# Patient Record
Sex: Male | Born: 1980 | Race: Black or African American | Hispanic: No | Marital: Married | Smoking: Current every day smoker
Health system: Southern US, Community
[De-identification: ages and names within clinical notes are randomized; demographics above are authoritative.]

## PROBLEM LIST (undated history)

## (undated) DIAGNOSIS — F319 Bipolar disorder, unspecified: Secondary | ICD-10-CM

## (undated) DIAGNOSIS — J45909 Unspecified asthma, uncomplicated: Secondary | ICD-10-CM

## (undated) DIAGNOSIS — I10 Essential (primary) hypertension: Secondary | ICD-10-CM

## (undated) DIAGNOSIS — F209 Schizophrenia, unspecified: Secondary | ICD-10-CM

## (undated) HISTORY — PX: OTHER SURGICAL HISTORY: SHX169

---

## 2013-11-12 ENCOUNTER — Emergency Department (INDEPENDENT_AMBULATORY_CARE_PROVIDER_SITE_OTHER)
Admission: EM | Admit: 2013-11-12 | Discharge: 2013-11-12 | Disposition: A | Discharge status: Home / Self Care | Payer: Medicare Other | Source: Home / Self Care | Attending: Emergency Medicine | Admitting: Emergency Medicine

## 2013-11-12 ENCOUNTER — Emergency Department (INDEPENDENT_AMBULATORY_CARE_PROVIDER_SITE_OTHER): Payer: Medicare Other

## 2013-11-12 ENCOUNTER — Encounter (HOSPITAL_COMMUNITY): Payer: Self-pay | Admitting: Emergency Medicine

## 2013-11-12 DIAGNOSIS — R030 Elevated blood-pressure reading, without diagnosis of hypertension: Secondary | ICD-10-CM

## 2013-11-12 DIAGNOSIS — M79676 Pain in unspecified toe(s): Secondary | ICD-10-CM

## 2013-11-12 DIAGNOSIS — IMO0001 Reserved for inherently not codable concepts without codable children: Secondary | ICD-10-CM

## 2013-11-12 DIAGNOSIS — M79609 Pain in unspecified limb: Secondary | ICD-10-CM

## 2013-11-12 NOTE — ED Notes (Signed)
Pt c/o pain 5th toe from greater toe onset 2 days... Recalls waking up and experiencing pain  Denies: inj/trauma... Pain increases w/activity BP today is 147/120... Denies: CP, SOB, nauseas, diaphoresis.  He is alert w/no signs of acute distress.

## 2013-11-12 NOTE — ED Provider Notes (Signed)
CSN: [id number]     Arrival date & time 11/12/13  1042 History   None    Chief Complaint  Patient presents with  . Toe Pain   (Consider location/radiation/quality/duration/timing/severity/associated sxs/prior Treatment) HPI Comments: Stubbed toe 2 days ago and has been hurting since; hurts to walk on it so it limping.   Patient is a 33 y.o. male presenting with toe pain. The history is provided by the patient.  Toe Pain This is a new problem. The current episode started 2 days ago. The problem occurs constantly. The problem has not changed since onset.Nothing aggravates the symptoms. Nothing relieves the symptoms. He has tried nothing for the symptoms.    History reviewed. No pertinent past medical history. History reviewed. No pertinent past surgical history. History reviewed. No pertinent family history. History  Substance Use Topics  . Smoking status: Current Every Day Smoker -- 1.00 packs/day    Types: Cigarettes  . Smokeless tobacco: Not on file  . Alcohol Use: Yes    Review of Systems  Constitutional: Negative for fever and chills.  Musculoskeletal:       Toe injury  Skin: Negative for color change and wound.    Allergies  Review of patient's allergies indicates no known allergies.  Home Medications  No current outpatient prescriptions on file. BP 147/120  Pulse 76  Temp(Src) 98.3 F (36.8 C) (Oral)  Resp 16  SpO2 100% Physical Exam  Constitutional: He appears well-developed and well-nourished. No distress.  Musculoskeletal:       Right foot: He exhibits bony tenderness. He exhibits normal range of motion, no swelling and no deformity.  R 5th toe tender to palp  Skin: Skin is warm and dry. No bruising noted. No erythema.    ED Course  Procedures (including critical care time) Labs Review Labs Reviewed - No data to display Imaging Review Dg Toe 5th Right  11/12/2013   CLINICAL DATA:  Right little toe injury and pain.  EXAM: RIGHT FIFTH TOE - 3 view   COMPARISON:  None.  FINDINGS: There is no evidence of fracture or dislocation. There is no evidence of arthropathy or other focal bone abnormality. Soft tissues are unremarkable.  IMPRESSION: Negative.   Electronically Signed   By: Myles RosenthalJohn  Stahl M.D.   On: 11/12/2013 13:18    EKG Interpretation    Date/Time:    Ventricular Rate:    PR Interval:    QRS Duration:   QT Interval:    QTC Calculation:   R Axis:     Text Interpretation:              MDM   1. Toe pain   2. Elevated blood pressure   Bp remains elevated on recheck. Pt reports being told his bp was high 5 years ago, but has never been recommended to take medicine for it. Recommended pt monitor bp for 3 weeks, record readings and take to pcp if they remain over 120/80.       Cathlyn ParsonsAngela M Antonina Deziel, NP 11/12/13 [phone]

## 2013-11-12 NOTE — Discharge Instructions (Signed)
Soak your toe in warm water to help with the pain. Try using aleve as directed on the bottle for pain.   Take your blood pressure 1-2 times per week for the next 3 weeks and keep a record of it.  If your blood pressure continues to be greater than 120/80 (if either number is high), talk to your doctor about getting blood pressure medicine.    Hypertension Hypertension is another name for high blood pressure. High blood pressure may mean that your heart needs to work harder to pump blood. Blood pressure consists of two numbers, which includes a higher number over a lower number (example: 110/72). HOME CARE   Make lifestyle changes as told by your doctor. This may include weight loss and exercise.  Take your blood pressure medicine every day.  Limit how much salt you use.  Stop smoking if you smoke.  Do not use drugs.  Talk to your doctor if you are using decongestants or birth control pills. These medicines might make blood pressure higher.  Females should not drink more than 1 alcoholic drink per day. Males should not drink more than 2 alcoholic drinks per day.  See your doctor as told. GET HELP RIGHT AWAY IF:   You have a blood pressure reading with a top number of 180 or higher.  You get a very bad headache.  You get blurred or changing vision.  You feel confused.  You feel weak, numb, or faint.  You get chest or belly (abdominal) pain.  You throw up (vomit).  You cannot breathe very well. MAKE SURE YOU:   Understand these instructions.  Will watch your condition.  Will get help right away if you are not doing well or get worse. Document Released: 03/29/2008 Document Revised: 01/03/2012 Document Reviewed: 03/29/2008 [facility] Patient Information 2014 [employer] [place].

## 2013-11-14 NOTE — ED Provider Notes (Signed)
Medical screening examination/treatment/procedure(s) were performed by non-physician practitioner and as supervising physician I was immediately available for consultation/collaboration.  Netta Fodge, M.D.  Dushawn Pusey C Deni Lefever, MD 11/14/13 0753 

## 2013-11-19 ENCOUNTER — Encounter (HOSPITAL_COMMUNITY): Payer: Self-pay | Admitting: Emergency Medicine

## 2013-11-19 ENCOUNTER — Emergency Department (HOSPITAL_COMMUNITY)
Admission: EM | Admit: 2013-11-19 | Discharge: 2013-11-19 | Discharge status: Against Medical Advice | Payer: Medicare Other | Attending: Emergency Medicine | Admitting: Emergency Medicine

## 2013-11-19 DIAGNOSIS — R079 Chest pain, unspecified: Secondary | ICD-10-CM | POA: Insufficient documentation

## 2013-11-19 HISTORY — DX: Schizophrenia, unspecified: F20.9

## 2013-11-19 HISTORY — DX: Essential (primary) hypertension: I10

## 2013-11-19 HISTORY — DX: Bipolar disorder, unspecified: F31.9

## 2013-11-19 HISTORY — DX: Unspecified asthma, uncomplicated: J45.909

## 2013-11-19 LAB — POCT I-STAT TROPONIN I: Troponin i, poc: 0 ng/mL (ref 0.00–0.08)

## 2013-11-19 LAB — CBC
HEMATOCRIT: 46.6 % (ref 39.0–52.0)
Hemoglobin: 16.4 g/dL (ref 13.0–17.0)
MCH: 35 pg — AB (ref 26.0–34.0)
MCHC: 35.2 g/dL (ref 30.0–36.0)
MCV: 99.4 fL (ref 78.0–100.0)
Platelets: 208 10*3/uL (ref 150–400)
RBC: 4.69 MIL/uL (ref 4.22–5.81)
RDW: 12.1 % (ref 11.5–15.5)
WBC: 7.6 10*3/uL (ref 4.0–10.5)

## 2013-11-19 LAB — BASIC METABOLIC PANEL
BUN: 22 mg/dL (ref 6–23)
CO2: 23 mEq/L (ref 19–32)
CREATININE: 1.36 mg/dL — AB (ref 0.50–1.35)
Calcium: 9.4 mg/dL (ref 8.4–10.5)
Chloride: 102 mEq/L (ref 96–112)
GFR calc Af Amer: 78 mL/min — ABNORMAL LOW (ref >90)
GFR, EST NON AFRICAN AMERICAN: 68 mL/min — AB (ref >90)
Glucose, Bld: 95 mg/dL (ref 70–99)
Potassium: 4.7 mEq/L (ref 3.7–5.3)
Sodium: 140 mEq/L (ref 137–147)

## 2013-11-19 LAB — PRO B NATRIURETIC PEPTIDE: Pro B Natriuretic peptide (BNP): 10.7 pg/mL (ref 0–125)

## 2013-11-19 MED ORDER — ASPIRIN 81 MG PO CHEW
324.0000 mg | CHEWABLE_TABLET | Freq: Once | ORAL | Status: AC
  Administered 2013-11-19: 324 mg via ORAL
  Filled 2013-11-19: qty 4

## 2013-11-19 NOTE — ED Notes (Signed)
Patient states he was getting ready to eat and started with CP to the left chest.  Denies radiating, +SOB

## 2015-05-31 ENCOUNTER — Emergency Department (HOSPITAL_COMMUNITY): Payer: Medicare Other

## 2015-05-31 ENCOUNTER — Encounter (HOSPITAL_COMMUNITY): Payer: Self-pay | Admitting: Emergency Medicine

## 2015-05-31 ENCOUNTER — Emergency Department (HOSPITAL_COMMUNITY)
Admission: EM | Admit: 2015-05-31 | Discharge: 2015-05-31 | Disposition: A | Discharge status: Home / Self Care | Payer: Medicare Other | Attending: Emergency Medicine | Admitting: Emergency Medicine

## 2015-05-31 DIAGNOSIS — I10 Essential (primary) hypertension: Secondary | ICD-10-CM | POA: Diagnosis not present

## 2015-05-31 DIAGNOSIS — Z8659 Personal history of other mental and behavioral disorders: Secondary | ICD-10-CM | POA: Diagnosis not present

## 2015-05-31 DIAGNOSIS — R0789 Other chest pain: Secondary | ICD-10-CM | POA: Insufficient documentation

## 2015-05-31 DIAGNOSIS — Z720 Tobacco use: Secondary | ICD-10-CM | POA: Diagnosis not present

## 2015-05-31 DIAGNOSIS — J45909 Unspecified asthma, uncomplicated: Secondary | ICD-10-CM | POA: Insufficient documentation

## 2015-05-31 DIAGNOSIS — R079 Chest pain, unspecified: Secondary | ICD-10-CM | POA: Diagnosis present

## 2015-05-31 LAB — CBC
HCT: 42.2 % (ref 39.0–52.0)
Hemoglobin: 14.6 g/dL (ref 13.0–17.0)
MCH: 35 pg — ABNORMAL HIGH (ref 26.0–34.0)
MCHC: 34.6 g/dL (ref 30.0–36.0)
MCV: 101.2 fL — ABNORMAL HIGH (ref 78.0–100.0)
Platelets: 216 10*3/uL (ref 150–400)
RBC: 4.17 MIL/uL — ABNORMAL LOW (ref 4.22–5.81)
RDW: 12.4 % (ref 11.5–15.5)
WBC: 9.2 10*3/uL (ref 4.0–10.5)

## 2015-05-31 LAB — BASIC METABOLIC PANEL
Anion gap: 8 (ref 5–15)
BUN: 12 mg/dL (ref 6–20)
CO2: 30 mmol/L (ref 22–32)
Calcium: 9.3 mg/dL (ref 8.9–10.3)
Chloride: 106 mmol/L (ref 101–111)
Creatinine, Ser: 1.1 mg/dL (ref 0.61–1.24)
GFR calc Af Amer: >60 mL/min (ref >60)
GFR calc non Af Amer: >60 mL/min (ref >60)
Glucose, Bld: 118 mg/dL — ABNORMAL HIGH (ref 65–99)
Potassium: 4.3 mmol/L (ref 3.5–5.1)
Sodium: 144 mmol/L (ref 135–145)

## 2015-05-31 LAB — I-STAT TROPONIN, ED: Troponin i, poc: 0 ng/mL (ref 0.00–0.08)

## 2015-05-31 MED ORDER — METHOCARBAMOL 500 MG PO TABS: 1000.0000 mg | ORAL_TABLET | Freq: Four times a day (QID) | ORAL | Status: DC | PRN

## 2015-05-31 MED ORDER — NAPROXEN 250 MG PO TABS: 250.0000 mg | ORAL_TABLET | Freq: Two times a day (BID) | ORAL | Status: DC | PRN

## 2015-05-31 MED ORDER — KETOROLAC TROMETHAMINE 30 MG/ML IJ SOLN
30.0000 mg | Freq: Once | INTRAMUSCULAR | Status: AC
  Administered 2015-05-31: 30 mg via INTRAVENOUS
  Filled 2015-05-31: qty 1

## 2015-05-31 MED ORDER — OXYCODONE-ACETAMINOPHEN 5-325 MG PO TABS
2.0000 | ORAL_TABLET | Freq: Once | ORAL | Status: AC
  Administered 2015-05-31: 2 via ORAL
  Filled 2015-05-31: qty 2

## 2015-05-31 MED ORDER — HYDROCODONE-ACETAMINOPHEN 5-325 MG PO TABS: ORAL_TABLET | ORAL | Status: DC

## 2015-05-31 NOTE — ED Provider Notes (Signed)
CSN: [id number]     Arrival date & time 05/31/15  2041 History   First MD Initiated Contact with Patient 05/31/15 2057     Chief Complaint  Patient presents with  . Chest Pain      HPI Pt was seen at 2105. Per pt, c/o gradual onset and persistence of constant left sided chest wall "pain" for the past 2 days. Pain worsens with palpation of the area and body position changes. Pt has not taken any medication to treat his symptoms. Denies injury, no palpitations, no SOB/cough, no abd pain, no N/V/D, no back pain, no fevers, no rash.    Past Medical History  Diagnosis Date  . Hypertension   . Asthma   . Bipolar affective   . Schizophrenia    Past Surgical History  Procedure Laterality Date  . Left knee    . Head injury      History  Substance Use Topics  . Smoking status: Current Every Day Smoker -- 1.00 packs/day    Types: Cigarettes  . Smokeless tobacco: Not on file  . Alcohol Use: Yes    Review of Systems ROS: Statement: All systems negative except as marked or noted in the HPI; Constitutional: Negative for fever and chills. ; ; Eyes: Negative for eye pain, redness and discharge. ; ; ENMT: Negative for ear pain, hoarseness, nasal congestion, sinus pressure and sore throat. ; ; Cardiovascular: Negative for palpitations, diaphoresis, dyspnea and peripheral edema. ; ; Respiratory: Negative for cough, wheezing and stridor. ; ; Gastrointestinal: Negative for nausea, vomiting, diarrhea, abdominal pain, blood in stool, hematemesis, jaundice and rectal bleeding. . ; ; Genitourinary: Negative for dysuria, flank pain and hematuria. ; ; Musculoskeletal: +chest wall pain. Negative for back pain and neck pain. Negative for swelling and trauma.; ; Skin: Negative for pruritus, rash, abrasions, blisters, bruising and skin lesion.; ; Neuro: Negative for headache, lightheadedness and neck stiffness. Negative for weakness, altered level of consciousness , altered mental status, extremity weakness,  paresthesias, involuntary movement, seizure and syncope.      Allergies  Review of patient's allergies indicates no known allergies.  Home Medications   Prior to Admission medications   Not on File   BP 157/94 mmHg  Pulse 72  Temp(Src) 98.4 F (36.9 C)  Resp 18  SpO2 96% Physical Exam  2110: Physical examination:  Nursing notes reviewed; Vital signs and O2 SAT reviewed;  Constitutional: Well developed, Well nourished, Well hydrated, In no acute distress; Head:  Normocephalic, atraumatic; Eyes: EOMI, PERRL, No scleral icterus; ENMT: Mouth and pharynx normal, Mucous membranes moist; Neck: Supple, Full range of motion, No lymphadenopathy; Cardiovascular: Regular rate and rhythm, No murmur, rub, or gallop; Respiratory: Breath sounds clear & equal bilaterally, No rales, rhonchi, wheezes.  Speaking full sentences with ease, Normal respiratory effort/excursion; Chest: Nontender, Movement normal; Abdomen: Soft, Nontender, Nondistended, Normal bowel sounds; Genitourinary: No CVA tenderness; Extremities: Pulses normal, No tenderness, No edema, No calf edema or asymmetry.; Neuro: AA&Ox3, Major CN grossly intact.  Speech clear. No gross focal motor or sensory deficits in extremities.; Skin: Color normal, Warm, Dry.   ED Course  Procedures     EKG Interpretation   Date/Time:  Saturday May 31 2015 20:51:52 EDT Ventricular Rate:  73 PR Interval:  134 QRS Duration: 92 QT Interval:  368 QTC Calculation: 405 R Axis:   43 Text Interpretation:  Sinus rhythm Nonspecific T abnormalities, lateral  leads Baseline wander When compared with ECG of 11/19/2013 No significant  change was  found Confirmed by [facility]  MD, Nicholos Johns [phone]) on 05/31/2015  9:22:41 PM      MDM  MDM Reviewed: previous chart, nursing note and vitals Reviewed previous: labs and ECG Interpretation: labs, ECG and x-ray     Results for orders placed or performed during the hospital encounter of 05/31/15  Basic metabolic  panel  Result Value Ref Range   Sodium 144 135 - 145 mmol/L   Potassium 4.3 3.5 - 5.1 mmol/L   Chloride 106 101 - 111 mmol/L   CO2 30 22 - 32 mmol/L   Glucose, Bld 118 (H) 65 - 99 mg/dL   BUN 12 6 - 20 mg/dL   Creatinine, Ser 5.62 0.61 - 1.24 mg/dL   Calcium 9.3 8.9 - 13.0 mg/dL   GFR calc non Af Amer >60 >60 mL/min   GFR calc Af Amer >60 >60 mL/min   Anion gap 8 5 - 15  CBC  Result Value Ref Range   WBC 9.2 4.0 - 10.5 K/uL   RBC 4.17 (L) 4.22 - 5.81 MIL/uL   Hemoglobin 14.6 13.0 - 17.0 g/dL   HCT 86.5 78.4 - 69.6 %   MCV 101.2 (H) 78.0 - 100.0 fL   MCH 35.0 (H) 26.0 - 34.0 pg   MCHC 34.6 30.0 - 36.0 g/dL   RDW 29.5 28.4 - 13.2 %   Platelets 216 150 - 400 K/uL  I-stat troponin, ED  Result Value Ref Range   Troponin i, poc 0.00 0.00 - 0.08 ng/mL   Comment 3           Dg Chest 2 View 05/31/2015   CLINICAL DATA:  Increasing chest pain for several months, history smoking, hypertension, asthma  EXAM: CHEST  2 VIEW  COMPARISON:  None  FINDINGS: Normal heart size, mediastinal contours, and pulmonary vascularity.  Lungs clear.  No pneumothorax.  Bones unremarkable.  IMPRESSION: Normal exam.   Electronically Signed   By: Ulyses Southward M.D.   On: 05/31/2015 21:32    2215:  Doubt PE as cause for symptoms with low risk [place].  Doubt ACS as cause for symptoms with normal troponin and unchanged EKG from previous after 2 days of constant symptoms. Tx symptomatically at this time. Dx and testing d/w pt and family.  Questions answered.  Verb understanding, agreeable to d/c home with outpt f/u.    Samuel Jester, DO 06/04/15 1655

## 2015-05-31 NOTE — Discharge Instructions (Signed)
°Emergency Department Resource Guide °1) Find a Doctor and Pay Out of Pocket °Although you won't have to find out who is covered by your insurance plan, it is a good idea to ask around and get recommendations. You will then need to call the office and see if the doctor you have chosen will accept you as a new patient and what types of options they offer for patients who are self-pay. Some doctors offer discounts or will set up payment plans for their patients who do not have insurance, but you will need to ask so you aren't surprised when you get to your appointment. ° °2) Contact Your Local Health Department °Not all health departments have doctors that can see patients for sick visits, but many do, so it is worth a call to see if yours does. If you don't know where your local health department is, you can check in your phone book. The CDC also has a tool to help you locate your state's health department, and many state websites also have listings of all of their local health departments. ° °3) Find a Walk-in Clinic °If your illness is not likely to be very severe or complicated, you may want to try a walk in clinic. These are popping up all over the country in pharmacies, drugstores, and shopping centers. They're usually staffed by nurse practitioners or physician assistants that have been trained to treat common illnesses and complaints. They're usually fairly quick and inexpensive. However, if you have serious medical issues or chronic medical problems, these are probably not your best option. ° °No Primary Care Doctor: °- Call [facility] at  832-8000 - they can help you locate a primary care doctor that  accepts your insurance, provides certain services, etc. °- Physician Referral Service- 1-800-533-3463 ° °Chronic Pain Problems: °Organization         Address  Phone   Notes  °[facility]  (336) 297-2271 Patients need to be referred by their primary care doctor.  ° °Medication  Assistance: °Organization         Address  Phone   Notes  °[place] [facility] [address] °[place], [place] [address] (336) 641-8030 --Must be a resident of [place] °-- Must have NO insurance coverage whatsoever (no Medicaid/ Medicare, etc.) °-- The pt. MUST have a primary care doctor that directs their care regularly and follows them in the community °  °[facility]  (866) 331-1348   °[employer]  (888) 892-1162   ° °Agencies that provide inexpensive medical care: °Organization         Address  Phone   Notes  °[facility]  (336) 832-8035   °[facility]    (336) 832-7272   °[facility] [address] °[place], [place] [address] (336) 832-4777   °[facility] [address], °[place] (336) 271-4999   °[facility]    (336) 373-0678   °[facility]    (336) 272-1050   °[facility] ° [address], [place] Phone:  (336) 832-4444, Fax:  (336) 832-4440 Hours of Operation:  9 am - 6 pm, M-F.  Also accepts Medicaid/Medicare and self-pay.  °[facility] ° [address], [place] Phone: (336) [phone], Fax: (336) [phone]. Hours of Operation:  8:30 am - 5:30 pm, M-F.  Also accepts Medicaid and self-pay.  °[facility] [place] [address], [place] Phone: (336) 878-6027   °[facility] [address], [place], [place] (336)723-1848, Ext. 123 Mondays & Thursdays: 7-9 AM.  First 15 patients are seen on a first come, first serve basis. °  ° °Medicaid-accepting [place] Providers: ° °Organization         Address  Phone   Notes  °[facility] [address], [place] (336) 641-2100 Also accepts self-pay patients.  °[facility] [address], [place] ° (336) 856-9996   °[facility] [address], [place]  (336) 288-8857   °[facility] [address] [place] [address], [place] (336) 299-7000   °Veita Bland [address], [place]  ° (336) 373-1557 Only accepts [place] Access Medicaid patients after they have their name applied to their card.  ° °Self-Pay (no insurance) in [place]: ° °Organization         Address  Phone   Notes  °Sickle Cell Patients, [facility] [address], [place] (336) 832-1970   °[facility] [address] [place] (336) 832-4400   °[facility] ° [address], [place] (336) 992-4800   °[facility]/Dr. Osei-Bonsu ° [address] [place] [address], [place] or [address], [place] (336) [phone] Phone number for both [place] and [place] locations is the same.  °[facility] [address], [place] (336) 299-0000   °[facility] [address] [place] [address], [place] or [address] (336) 852-7530 °(336) 878-2260   °[facility] [address], [place] (336) 350-1642, phone; (336) 294-5005, fax Sees patients 1st and 3rd Saturday of every month.  Must not qualify for public or private insurance (i.e. Medicaid, Medicare, [place] Health Choice, Veterans' Benefits) • Household income should be no more than 200% of the poverty level •The clinic cannot treat you if you are pregnant or think you are pregnant • Sexually transmitted diseases are not treated at the clinic.  ° ° °Dental Care: °Organization         Address  Phone  Notes  °[place] [facility] [address],  (336) 641-6152 Accepts children up to age 21 who are enrolled in Medicaid or [place] Health Choice; pregnant women with a Medicaid card; and children who have applied for Medicaid or [place] Health Choice, but were declined, whose parents can pay a reduced fee at time of service.  °[place]  [facility] [place]  [address], [place] (336) 641-7733 Accepts children up to age 21 who are enrolled in Medicaid or [place] Health Choice; pregnant women with a Medicaid card; and children who have applied for Medicaid or [place] Health Choice, but were declined, whose parents can pay a reduced fee at time of service.  °[facility] ° [address],  (336) 641-4533 Patients are seen by appointment only. Walk-ins are not accepted. [facility] will see patients 18 years of age and older. °Monday - Tuesday (8am-5pm) °Most Wednesdays (8:30-5pm) °$30 per visit, cash only  °[facility] ° [address], [place] (336) 641-4533 Patients are seen by appointment only. Walk-ins are not accepted. [facility] will see patients 18 years of age and older. °One   Wednesday Evening (Monthly: Volunteer Based).  $30 per visit, cash only  °[facility]  (919) 537-3737 for adults; Children under age 4, call Graduate Pediatric Dentistry at (919) [phone]. Children aged 4-14, please call (919) [phone] to request a pediatric application. ° Dental services are provided in all areas of dental care including fillings, crowns and bridges, complete and partial dentures, implants, gum treatment, root canals, and extractions. Preventive care is also provided. Treatment is provided to both adults and children. °Patients are selected via a lottery and there is often a waiting list. °  °[facility] [address], °[place] ° (336) 763-8833 [email or web address] °  °[facility] [address], [place], [place] (336)723-1848, Ext. 123 Second and Fourth Thursday of each month, opens at 6:30 AM; Clinic ends at 9 AM.  Patients are seen on a first-come first-served basis, and a limited number are seen during each clinic.  ° °[facility] ° [address], [place], [place] (336) 723-7904    Eligibility Requirements °You must have lived in [place], [place], or [place] counties for at least the last three months. °  You cannot be eligible for state or federal sponsored healthcare insurance, including Veterans Administration, Medicaid, or Medicare. °  You generally cannot be eligible for healthcare insurance through your employer.  °  How to apply: °Eligibility screenings are held every Tuesday and Wednesday afternoon from 1:00 pm until 4:00 pm. You do not need an appointment for the interview!  °[facility] [address], [place], [place] 336-631-2330   °[place] [facility]  336-342-8273   °[place]  336-703-3100   °[facility]  336-570-6415   ° °Behavioral Health Resources in the Community: °Intensive Outpatient Programs °Organization         Address  Phone  Notes  °[facility] [address], [place], [place] 336-878-6098   °[facility] [address], [place], [place] 336-832-9800   °[facility] [address], [place], [place] ° 336-882-2125   °[facility] [address],  °[place], [place] 1-800-853-5163 or 336-641-4981   °Substance Abuse Resources °Organization         Address  Phone  Notes  °[facility]  336-882-2125   °[facility]  336-784-9470   °[facility]  336-285-9073   °[facility]  336-845-3988   °Residential & Outpatient Substance Abuse Program  1-800-659-3381   °Psychological Services °Organization         Address  Phone  Notes  ° Health  336- 832-9600   °[facility]  336- 378-7881   °[facility] [address], [place] 1-800-853-5163 or 336-641-4981   ° °Mobile Crisis Teams °Organization         Address  Phone  Notes  °[facility], Mobile Crisis Care Unit  1-877-626-1772   °Assertive °[facility] ° [address]  [place], [place] 336-834-9664   °Sharon DeEsch [address] °[place] 336-554-5454   ° °Self-Help/Support Groups °Organization         Address  Phone             Notes  °[facility]  - variety of support groups  336- 373-1402 Call for more information  °Narcotics Anonymous (NA), [facility] [address], °[place]  2 meetings at this location  ° °  Residential Treatment Programs °Organization         Address  Phone  Notes  °[facility] [address],    °[place]  1-866-801-8205   °[facility] ° [address], [place], [place] 704-293-8524   °[facility] [address], [place] 336-845-3988 Admissions: 8am-3pm M-F  °[facility] [address]    °[place], [place] 336-841-1104   °[facility] [address], [place], [place] 336-379-7146   °[facility] [address]  °[place], [place] 336-285-9073   °[facility] Intensive Outpatient [address], [place], [place] 336-852-3033   °[facility] [address]  °[place], [place] 1-877-615-2722 or 336-784-9470   °[facility] ([facility] [address] [place], [place] 336-227-7417 Accepts Medicaid  °[facility] [address]  ° [place] 1-800-659-3381 Substance Abuse/Addiction Treatment  ° °[place] Behavioral Health Resources °Organization         Address  Phone  Notes  °[facility]  (888) 581-9988   °Julie Brannon, PhD [address] [place], [place]   (336) 349-5553 or (336) 951-0000   °[facility]   [address] °[place], [place] (336) 349-4454   °[facility] [address], [place], [place] (336) 342-8316 Insurance/Medicaid/sponsorship through [facility]  °[facility] [address]                                    [place], [place] (336) 342-8316 Therapy/tele-psych/case    °[facility] [address]  ° [place],  (336) 349-2233    °Dr. Arfeen  (336) 349-4544   °[facility] [place]  [employer] [place] [facility] 1) [address], [place] °2) [address], [place] °3)  [address], [place] (336) 349-3220 °(336) 342-7768 ° °(336) 342-8140   °[place] Child Abuse [facility] (336) 342-1394 or (336) 342-3537 (After Hours)    ° ° °Take the prescriptions as directed.  Apply moist heat or ice to the area(s) of discomfort, for 15 minutes at a time, several times per day for the next few days.  Do not fall asleep on a heating or ice pack.  Call your regular medical doctor on Monday to schedule a follow up appointment this week.  Return to the Emergency Department immediately if worsening. ° °

## 2015-05-31 NOTE — ED Notes (Signed)
Pt presents to ED with c/o chest pain.  Pt reports that that he has been having left chest pain "everyday", worse with breathing and leaning forward.  Pt denies dizziness but admits to headache.

## 2015-08-15 ENCOUNTER — Emergency Department (HOSPITAL_COMMUNITY): Payer: Medicare Other

## 2015-08-15 ENCOUNTER — Emergency Department (HOSPITAL_COMMUNITY)
Admission: EM | Admit: 2015-08-15 | Discharge: 2015-08-15 | Disposition: A | Discharge status: Home / Self Care | Payer: Medicare Other | Attending: Emergency Medicine | Admitting: Emergency Medicine

## 2015-08-15 ENCOUNTER — Encounter (HOSPITAL_COMMUNITY): Payer: Self-pay | Admitting: *Deleted

## 2015-08-15 DIAGNOSIS — Z79899 Other long term (current) drug therapy: Secondary | ICD-10-CM | POA: Diagnosis not present

## 2015-08-15 DIAGNOSIS — J45909 Unspecified asthma, uncomplicated: Secondary | ICD-10-CM | POA: Insufficient documentation

## 2015-08-15 DIAGNOSIS — F319 Bipolar disorder, unspecified: Secondary | ICD-10-CM | POA: Insufficient documentation

## 2015-08-15 DIAGNOSIS — Z720 Tobacco use: Secondary | ICD-10-CM | POA: Diagnosis not present

## 2015-08-15 DIAGNOSIS — R079 Chest pain, unspecified: Secondary | ICD-10-CM | POA: Diagnosis present

## 2015-08-15 DIAGNOSIS — R590 Localized enlarged lymph nodes: Secondary | ICD-10-CM | POA: Diagnosis not present

## 2015-08-15 DIAGNOSIS — F1110 Opioid abuse, uncomplicated: Secondary | ICD-10-CM | POA: Insufficient documentation

## 2015-08-15 DIAGNOSIS — F1210 Cannabis abuse, uncomplicated: Secondary | ICD-10-CM | POA: Insufficient documentation

## 2015-08-15 DIAGNOSIS — R0789 Other chest pain: Secondary | ICD-10-CM

## 2015-08-15 DIAGNOSIS — F209 Schizophrenia, unspecified: Secondary | ICD-10-CM | POA: Insufficient documentation

## 2015-08-15 DIAGNOSIS — I10 Essential (primary) hypertension: Secondary | ICD-10-CM | POA: Insufficient documentation

## 2015-08-15 DIAGNOSIS — R55 Syncope and collapse: Secondary | ICD-10-CM

## 2015-08-15 LAB — COMPREHENSIVE METABOLIC PANEL WITH GFR
ALT: 20 U/L (ref 17–63)
AST: 23 U/L (ref 15–41)
Albumin: 4.5 g/dL (ref 3.5–5.0)
Alkaline Phosphatase: 66 U/L (ref 38–126)
Anion gap: 8 (ref 5–15)
BUN: 15 mg/dL (ref 6–20)
CO2: 26 mmol/L (ref 22–32)
Calcium: 9.4 mg/dL (ref 8.9–10.3)
Chloride: 103 mmol/L (ref 101–111)
Creatinine, Ser: 1.24 mg/dL (ref 0.61–1.24)
GFR calc Af Amer: >60 mL/min
GFR calc non Af Amer: >60 mL/min
Glucose, Bld: 90 mg/dL (ref 65–99)
Potassium: 4.5 mmol/L (ref 3.5–5.1)
Sodium: 137 mmol/L (ref 135–145)
Total Bilirubin: 0.6 mg/dL (ref 0.3–1.2)
Total Protein: 7.7 g/dL (ref 6.5–8.1)

## 2015-08-15 LAB — URINALYSIS, ROUTINE W REFLEX MICROSCOPIC
Bilirubin Urine: NEGATIVE
Glucose, UA: NEGATIVE mg/dL
Hgb urine dipstick: NEGATIVE
Ketones, ur: NEGATIVE mg/dL
Nitrite: NEGATIVE
Protein, ur: NEGATIVE mg/dL
Specific Gravity, Urine: 1.023 (ref 1.005–1.030)
Urobilinogen, UA: 0.2 mg/dL (ref 0.0–1.0)
pH: 5.5 (ref 5.0–8.0)

## 2015-08-15 LAB — URINE MICROSCOPIC-ADD ON

## 2015-08-15 LAB — CBC WITH DIFFERENTIAL/PLATELET
Basophils Absolute: 0 K/uL (ref 0.0–0.1)
Basophils Relative: 0 %
Eosinophils Absolute: 0.1 K/uL (ref 0.0–0.7)
Eosinophils Relative: 1 %
HCT: 49 % (ref 39.0–52.0)
Hemoglobin: 16.7 g/dL (ref 13.0–17.0)
Lymphocytes Relative: 40 %
Lymphs Abs: 3.3 K/uL (ref 0.7–4.0)
MCH: 34.9 pg — ABNORMAL HIGH (ref 26.0–34.0)
MCHC: 34.1 g/dL (ref 30.0–36.0)
MCV: 102.5 fL — ABNORMAL HIGH (ref 78.0–100.0)
Monocytes Absolute: 0.6 K/uL (ref 0.1–1.0)
Monocytes Relative: 7 %
Neutro Abs: 4.3 K/uL (ref 1.7–7.7)
Neutrophils Relative %: 52 %
Platelets: 201 K/uL (ref 150–400)
RBC: 4.78 MIL/uL (ref 4.22–5.81)
RDW: 12.2 % (ref 11.5–15.5)
WBC: 8.3 K/uL (ref 4.0–10.5)

## 2015-08-15 LAB — RAPID URINE DRUG SCREEN, HOSP PERFORMED
Amphetamines: NOT DETECTED
Barbiturates: NOT DETECTED
Benzodiazepines: NOT DETECTED
Cocaine: NOT DETECTED
Opiates: POSITIVE — AB
Tetrahydrocannabinol: POSITIVE — AB

## 2015-08-15 LAB — I-STAT TROPONIN, ED
Troponin i, poc: 0 ng/mL (ref 0.00–0.08)
Troponin i, poc: 0 ng/mL (ref 0.00–0.08)

## 2015-08-15 MED ORDER — MORPHINE SULFATE (PF) 4 MG/ML IV SOLN
4.0000 mg | Freq: Once | INTRAVENOUS | Status: AC
  Administered 2015-08-15: 4 mg via INTRAVENOUS
  Filled 2015-08-15: qty 1

## 2015-08-15 NOTE — ED Notes (Signed)
Pt was unable to provide a urine sample at this time ?

## 2015-08-15 NOTE — Discharge Instructions (Signed)
Chest Pain Observation °It is often hard to give a specific diagnosis for the cause of chest pain. Among other possibilities your symptoms might be caused by inadequate oxygen delivery to your heart (angina). Angina that is not treated or evaluated can lead to a heart attack (myocardial infarction) or death. °Blood tests, electrocardiograms, and X-rays may have been done to help determine a possible cause of your chest pain. After evaluation and observation, your health care provider has determined that it is unlikely your pain was caused by an unstable condition that requires hospitalization. However, a full evaluation of your pain may need to be completed, with additional diagnostic testing as directed. It is very important to keep your follow-up appointments. Not keeping your follow-up appointments could result in permanent heart damage, disability, or death. If there is any problem keeping your follow-up appointments, you must call your health care provider. °HOME CARE INSTRUCTIONS  °Due to the slight chance that your pain could be angina, it is important to follow your health care provider's treatment plan and also maintain a healthy lifestyle: °· Maintain or work toward achieving a healthy weight. °· Stay physically active and exercise regularly. °· Decrease your salt intake. °· Eat a balanced, healthy diet. Talk to a dietitian to learn about heart-healthy foods. °· Increase your fiber intake by including whole grains, vegetables, fruits, and nuts in your diet. °· Avoid situations that cause stress, anger, or depression. °· Take medicines as advised by your health care provider. Report any side effects to your health care provider. Do not stop medicines or adjust the dosages on your own. °· Quit smoking. Do not use nicotine patches or gum until you check with your health care provider. °· Keep your blood pressure, blood sugar, and cholesterol levels within normal limits. °· Limit alcohol intake to no more than  1 drink per day for women who are not pregnant and 2 drinks per day for men. °· Do not abuse drugs. °SEEK IMMEDIATE MEDICAL CARE IF: °You have severe chest pain or pressure which may include symptoms such as: °· You feel pain or pressure in your arms, neck, jaw, or back. °· You have severe back or abdominal pain, feel sick to your stomach (nauseous), or throw up (vomit). °· You are sweating profusely. °· You are having a fast or irregular heartbeat. °· You feel short of breath while at rest. °· You notice increasing shortness of breath during rest, sleep, or with activity. °· You have chest pain that does not get better after rest or after taking your usual medicine. °· You wake from sleep with chest pain. °· You are unable to sleep because you cannot breathe. °· You develop a frequent cough or you are coughing up blood. °· You feel dizzy, faint, or experience extreme fatigue. °· You develop severe weakness, dizziness, fainting, or chills. °Any of these symptoms may represent a serious problem that is an emergency. Do not wait to see if the symptoms will go away. Call your local emergency services (911 in the [place] Do not drive yourself to the hospital. °MAKE SURE YOU: °· Understand these instructions. °· Will watch your condition. °· Will get help right away if you are not doing well or get worse. °  °This information is not intended to replace advice given to you by your health care provider. Make sure you discuss any questions you have with your health care provider. °  °Document Released: 11/13/2010 Document Revised: 10/16/2013 Document Reviewed: 04/12/2013 °Elsevier Interactive Patient   Education 2016 [employer].  Near-Syncope Near-syncope (commonly known as near fainting) is sudden weakness, dizziness, or feeling like you might pass out. During an episode of near-syncope, you may also develop pale skin, have tunnel vision, or feel sick to your stomach (nauseous). Near-syncope may occur when getting up  after sitting or while standing for a long time. It is caused by a sudden decrease in blood flow to the brain. This decrease can result from various causes or triggers, most of which are not serious. However, because near-syncope can sometimes be a sign of something serious, a medical evaluation is required. The specific cause is often not determined. HOME CARE INSTRUCTIONS  Monitor your condition for any changes. The following actions may help to alleviate any discomfort you are experiencing:  Have someone stay with you until you feel stable.  Lie down right away and prop your feet up if you start feeling like you might faint. Breathe deeply and steadily. Wait until all the symptoms have passed. Most of these episodes last only a few minutes. You may feel tired for several hours.   Drink enough fluids to keep your urine clear or pale yellow.   If you are taking blood pressure or heart medicine, get up slowly when seated or lying down. Take several minutes to sit and then stand. This can reduce dizziness.  Follow up with your health care provider as directed. SEEK IMMEDIATE MEDICAL CARE IF:   You have a severe headache.   You have unusual pain in the chest, abdomen, or back.   You are bleeding from the mouth or rectum, or you have black or tarry stool.   You have an irregular or very fast heartbeat.   You have repeated fainting or have seizure-like jerking during an episode.   You faint when sitting or lying down.   You have confusion.   You have difficulty walking.   You have severe weakness.   You have vision problems.  MAKE SURE YOU:   Understand these instructions.  Will watch your condition.  Will get help right away if you are not doing well or get worse.   This information is not intended to replace advice given to you by your health care provider. Make sure you discuss any questions you have with your health care provider.   Document Released:  10/11/2005 Document Revised: 10/16/2013 Document Reviewed: 03/16/2013 Elsevier Interactive Patient Education 2016 [employer].  Syncope Syncope is a medical term for fainting or passing out. This means you lose consciousness and drop to the ground. People are generally unconscious for less than 5 minutes. You may have some muscle twitches for up to 15 seconds before waking up and returning to normal. Syncope occurs more often in older adults, but it can happen to anyone. While most causes of syncope are not dangerous, syncope can be a sign of a serious medical problem. It is important to seek medical care.  CAUSES  Syncope is caused by a sudden drop in blood flow to the brain. The specific cause is often not determined. Factors that can bring on syncope include:  Taking medicines that lower blood pressure.  Sudden changes in posture, such as standing up quickly.  Taking more medicine than prescribed.  Standing in one place for too long.  Seizure disorders.  Dehydration and excessive exposure to heat.  Low blood sugar (hypoglycemia).  Straining to have a bowel movement.  Heart disease, irregular heartbeat, or other circulatory problems.  Fear, emotional distress,  seeing blood, or severe pain. SYMPTOMS  Right before fainting, you may:  Feel dizzy or light-headed.  Feel nauseous.  See all white or all black in your field of vision.  Have cold, clammy skin. DIAGNOSIS  Your health care provider will ask about your symptoms, perform a physical exam, and perform an electrocardiogram (ECG) to record the electrical activity of your heart. Your health care provider may also perform other heart or blood tests to determine the cause of your syncope which may include:  Transthoracic echocardiogram (TTE). During echocardiography, sound waves are used to evaluate how blood flows through your heart.  Transesophageal echocardiogram (TEE).  Cardiac monitoring. This allows your health care  provider to monitor your heart rate and rhythm in real time.  Holter monitor. This is a portable device that records your heartbeat and can help diagnose heart arrhythmias. It allows your health care provider to track your heart activity for several days, if needed.  Stress tests by exercise or by giving medicine that makes the heart beat faster. TREATMENT  In most cases, no treatment is needed. Depending on the cause of your syncope, your health care provider may recommend changing or stopping some of your medicines. HOME CARE INSTRUCTIONS  Have someone stay with you until you feel stable.  Do not drive, use machinery, or play sports until your health care provider says it is okay.  Keep all follow-up appointments as directed by your health care provider.  Lie down right away if you start feeling like you might faint. Breathe deeply and steadily. Wait until all the symptoms have passed.  Drink enough fluids to keep your urine clear or pale yellow.  If you are taking blood pressure or heart medicine, get up slowly and take several minutes to sit and then stand. This can reduce dizziness. SEEK IMMEDIATE MEDICAL CARE IF:   You have a severe headache.  You have unusual pain in the chest, abdomen, or back.  You are bleeding from your mouth or rectum, or you have black or tarry stool.  You have an irregular or very fast heartbeat.  You have pain with breathing.  You have repeated fainting or seizure-like jerking during an episode.  You faint when sitting or lying down.  You have confusion.  You have trouble walking.  You have severe weakness.  You have vision problems. If you fainted, call your local emergency services (911 in [place] Do not drive yourself to the hospital.    This information is not intended to replace advice given to you by your health care provider. Make sure you discuss any questions you have with your health care provider.   Document Released: 10/11/2005  Document Revised: 02/25/2015 Document Reviewed: 12/10/2011 Elsevier Interactive Patient Education 2016 [employer].  Syncope, commonly known as fainting, is a temporary loss of consciousness. It occurs when the blood flow to the brain is reduced. Vasovagal syncope (also called neurocardiogenic syncope) is a fainting spell in which the blood flow to the brain is reduced because of a sudden drop in heart rate and blood pressure. Vasovagal syncope occurs when the brain and the cardiovascular system (blood vessels) do not adequately communicate and respond to each other. This is the most common cause of fainting. It often occurs in response to fear or some other type of emotional or physical stress. The body has a reaction in which the heart starts beating too slowly or the blood vessels expand, reducing blood pressure. This type of fainting spell is generally  considered harmless. However, injuries can occur if a person takes a sudden fall during a fainting spell.  CAUSES  Vasovagal syncope occurs when a person's blood pressure and heart rate decrease suddenly, usually in response to a trigger. Many things and situations can trigger an episode. Some of these include:   Pain.   Fear.   The sight of blood or medical procedures, such as blood being drawn from a vein.   Common activities, such as coughing, swallowing, stretching, or going to the bathroom.   Emotional stress.   Prolonged standing, especially in a warm environment.   Lack of sleep or rest.   Prolonged lack of food.   Prolonged lack of fluids.   Recent illness.  The use of certain drugs that affect blood pressure, such as cocaine, alcohol, marijuana, inhalants, and opiates.  SYMPTOMS  Before the fainting episode, you may:   Feel dizzy or light headed.   Become pale.  Sense that you are going to faint.   Feel like the room is spinning.   Have tunnel vision, only seeing directly in front of you.   Feel  sick to your stomach (nauseous).   See spots or slowly lose vision.   Hear ringing in your ears.   Have a headache.   Feel warm and sweaty.   Feel a sensation of pins and needles. During the fainting spell, you will generally be unconscious for no longer than a couple minutes before waking up and returning to normal. If you get up too quickly before your body can recover, you may faint again. Some twitching or jerky movements may occur during the fainting spell.  DIAGNOSIS  Your health care provider will ask about your symptoms, take a medical history, and perform a physical exam. Various tests may be done to rule out other causes of fainting. These may include blood tests and tests to check the heart, such as electrocardiography, echocardiography, and possibly an electrophysiology study. When other causes have been ruled out, a test may be done to check the body's response to changes in position (tilt table test). TREATMENT  Most cases of vasovagal syncope do not require treatment. Your health care provider may recommend ways to avoid fainting triggers and may provide home strategies for preventing fainting. If you must be exposed to a possible trigger, you can drink additional fluids to help reduce your chances of having an episode of vasovagal syncope. If you have warning signs of an oncoming episode, you can respond by positioning yourself favorably (lying down). If your fainting spells continue, you may be given medicines to prevent fainting. Some medicines may help make you more resistant to repeated episodes of vasovagal syncope. Special exercises or compression stockings may be recommended. In rare cases, the surgical placement of a pacemaker is considered. HOME CARE INSTRUCTIONS   Learn to identify the warning signs of vasovagal syncope.   Sit or lie down at the first warning sign of a fainting spell. If sitting, put your head down between your legs. If you lie down, swing your  legs up in the air to increase blood flow to the brain.   Avoid hot tubs and saunas.  Avoid prolonged standing.  Drink enough fluids to keep your urine clear or pale yellow. Avoid caffeine.  Increase salt in your diet as directed by your health care provider.   If you have to stand for a long time, perform movements such as:   Crossing your legs.   Flexing and stretching  your leg muscles.   Squatting.   Moving your legs.   Bending over.   Only take over-the-counter or prescription medicines as directed by your health care provider. Do not suddenly stop any medicines without asking your health care provider first. SEEK MEDICAL CARE IF:   Your fainting spells continue or happen more frequently in spite of treatment.   You lose consciousness for more than a couple minutes.  You have fainting spells during or after exercising or after being startled.   You have new symptoms that occur with the fainting spells, such as:   Shortness of breath.  Chest pain.   Irregular heartbeat.   You have episodes of twitching or jerky movements that last longer than a few seconds.  You have episodes of twitching or jerky movements without obvious fainting. SEEK IMMEDIATE MEDICAL CARE IF:   You have injuries or bleeding after a fainting spell.   You have episodes of twitching or jerky movements that last longer than 5 minutes.   You have more than one spell of twitching or jerky movements before returning to consciousness after fainting.   This information is not intended to replace advice given to you by your health care provider. Make sure you discuss any questions you have with your health care provider.    Emergency Department Resource Guide 1) Find a Doctor and Pay Out of Pocket Although you won't have to find out who is covered by your insurance plan, it is a good idea to ask around and get recommendations. You will then need to call the office and see if the  doctor you have chosen will accept you as a new patient and what types of options they offer for patients who are self-pay. Some doctors offer discounts or will set up payment plans for their patients who do not have insurance, but you will need to ask so you aren't surprised when you get to your appointment.  2) Contact Your Local Health Department Not all health departments have doctors that can see patients for sick visits, but many do, so it is worth a call to see if yours does. If you don't know where your local health department is, you can check in your phone book. The CDC also has a tool to help you locate your state's health department, and many state websites also have listings of all of their local health departments.  3) Find a Walk-in Clinic If your illness is not likely to be very severe or complicated, you may want to try a walk in clinic. These are popping up all over the country in pharmacies, drugstores, and shopping centers. They're usually staffed by nurse practitioners or physician assistants that have been trained to treat common illnesses and complaints. They're usually fairly quick and inexpensive. However, if you have serious medical issues or chronic medical problems, these are probably not your best option.  No Primary Care Doctor: - Call Health Connect at  [phone] - they can help you locate a primary care doctor that  accepts your insurance, provides certain services, etc. - Physician Referral Service- [phone]  Chronic Pain Problems: Organization         Address  Phone   Notes  [facility]  [phone] Patients need to be referred by their primary care doctor.   Medication Assistance: Organization         Address  Phone   Notes  [facility] Medication Assistance Program [address] [place], [place] [address] [phone] --Must be a resident of [facility] -- Must have NO insurance coverage whatsoever (no Medicaid/  Medicare, etc.) -- The pt. MUST have a primary care doctor that directs their care regularly and follows them in the community   [facility]  [phone]   [employer]  [phone]    Agencies that provide inexpensive medical care: Organization         Address  Phone   Notes  [facility]  [phone]   [facility]    [phone]   [facility] [address] [place], [place] [address] [phone]   [facility] [address], [place] [phone]   [facility]    [phone]   [facility]    [phone]   [facility]  [address], [place] Phone:  [phone], Fax:  [phone] Hours of Operation:  9 am - 6 pm, M-F.  Also accepts Medicaid/Medicare and self-pay.  [facility]  [address], [place] Phone: [phone], Fax: [phone]. Hours of Operation:  8:30 am - 5:30 pm, M-F.  Also accepts Medicaid and self-pay.  [facility] [place] [address], [place] Phone: [phone]   [facility] [address] Natasha Bence [place], [place] [phone], Ext. [phone] Mondays & Thursdays: 7-9 AM.  First 15 patients are seen on a first come, first serve basis.    Medicaid-accepting [facility] Providers:  Organization         Address  Phone   Notes  [facility] [address], [place] [phone] Also accepts self-pay patients.  [facility] [address] Laurell Josephs [place], [place]  [phone]   [facility] [address], [place] [phone]   [facility] [address] [place] [phone]   Renaye Rakers [address], [place]   [phone] Only accepts [place] patients after they have their name applied to their card.    Self-Pay (no insurance) in [facility]:  Organization         Address  Phone   Notes  Sickle Cell Patients, [facility] [address] [place], [place] [phone]   [facility] [address] [place], [place] [phone]   [facility]  [address], [place] [phone]   [facility]/Dr. Osei-Bonsu  [address], [place] or [address], [place] [phone] Phone number for both [place] and [place] locations is the same.  [facility] [address] [place] [phone]   [facility] [address] [place] or [address] [phone]   [facility] [address], [place] [phone], phone; [phone], fax Sees patients 1st and 3rd Saturday of every month.  Must not qualify for public or private insurance (i.e. Medicaid, Medicare, [place] Health Choice, Veterans' Benefits)  Household income should be no more than 200% of the poverty level The clinic cannot treat you if you are pregnant or think you are pregnant  Sexually transmitted diseases are not treated at the clinic.    Dental Care:  Organization         Address  Phone  Notes  [facility] Department of [facility] [address] [place], [place] [phone] Accepts children up to age 82 who are enrolled in [place] or [place] Health Choice; pregnant women with a Medicaid card; and children who have applied for Medicaid or [place] Health Choice, but were declined, whose parents can pay a reduced fee at time of service.  [facility] Department of [facility]  [address], [place] [phone] Accepts children up to age 7 who are enrolled in [place] or [place] Health Choice; pregnant women with a Medicaid card; and children who have applied for Medicaid or [place] Health Choice,  but were declined, whose parents can pay a reduced fee at time of service.  [facility]  [address] [place], [place] [phone] Patients are seen by appointment only. Walk-ins are not accepted. [facility] will see patients 46 years of age and older. Monday - Tuesday (8am-5pm) Most Wednesdays (8:30-5pm) $30 per visit, cash only  [facility]  [address], [facility] [phone] Patients are seen by appointment only. Walk-ins are not accepted. [facility] will see patients 20 years of age and older. One Wednesday Evening (Monthly: Volunteer Based).  $30 per visit, cash only  [employer]  [phone] for adults; Children under age 64, call Graduate Pediatric Dentistry at [phone]. Children aged 30-14, please call [phone] to request a pediatric application.  Dental services are provided in all areas of dental care including fillings, crowns and bridges, complete and partial dentures, implants, gum treatment, root canals, and extractions. Preventive care is also provided. Treatment is provided to both adults and children. Patients are selected via a lottery and there is often a waiting list.   [facility] [address], [place]  [phone] [email or web address]   [facility] [address] [place], [place] [phone], Ext. [phone] Second and Fourth Thursday of each month, opens at 6:30 AM; Clinic ends at 9 AM.  Patients are seen on a first-come first-served basis, and a limited number are seen during each clinic.   [facility]  [address] [place], [place] [phone]   Eligibility Requirements You must have lived in [place], [place], or [place] counties for at least the last three months.   You cannot be eligible for state or federal sponsored [place], including [employer], [place], or [employer].   You generally  cannot be eligible for healthcare insurance through your employer.    How to apply: Eligibility screenings are held every Tuesday and Wednesday afternoon from 1:00 pm until 4:00 pm. You do not need an appointment for the interview!  [facility] [address], [place], [place] [phone]   [facility] Health Department  [phone]   [facility] Health Department  [phone]   [facility] Health Department  [phone]    Behavioral Health Resources in the Community: Intensive Outpatient Programs Organization         Address  Phone  Notes  [facility] Services [address] [place], [place] [phone]   [facility] Outpatient [address] [place], [place] [phone]   [facility] [address], [place], [place]  [phone]   [facility] [address]  [place],  [place] [phone] or [phone]   Substance Abuse Resources Organization         Address  Phone  Notes  [facility]  [phone]   [facility]  [phone]   [facility] [place]  [phone]   Floydene Flock  [phone]   Residential & Outpatient Substance Abuse Program  [phone]   Psychological Services Organization         Address  Phone  Notes  [facility]  [phone]   [facility]  [phone]   [facility] [address], [place] [phone] or [phone]    Mobile Crisis Teams Organization         Address  Phone  Notes  [facility], Mobile Crisis Care Unit  [phone]   [facility]  [address] [place], [place] [phone]   Doristine Locks [address] [place] [phone]    Self-Help/Support Groups Organization         Address  Phone             Notes  [facility] - variety of support groups  [phone]- [phone] Call for more  information  Narcotics Anonymous (NA), [facility] [address], [employer] [place]  2 meetings at this location   Statistician         Address  Phone  Notes  [facility] [address],    [place]  [phone]   [facility]  [address] [place] [address], [place], [place] [phone]   [facility] [address] [place], [place] [phone] Admissions: 8am-3pm M-F  [facility] [address],    [place], [place] [phone]   [facility] [address] [place], [place], [place] [phone]   [facility] [address],  [place], [place] [phone]   [facility] - Intensive Outpatient [address], [place], [place] [phone]   [facility] ([facility] [address] [place]  [place], [place] [phone] or [phone]   [facility] ([facility]) [address], [place], [place] [phone] Accepts Medicaid  [facility] [address],  [place] [phone] Substance Abuse/Addiction Treatment   [facility] Organization         Address  Phone  Notes  [facility]  [phone]   Angie Fava, PhD [address] [place], [place]   [phone] or [phone]   [facility] Behavioral   [address] [place], [place] [phone]   [facility] [address], [place], [place] [phone] Insurance/Medicaid/sponsorship through [facility] and Families [address]                                    [place], [place] [phone] Therapy/tele-psych/case  [facility] [address], [place] [phone]    Dr. Lolly Mustache  [phone]   [facility]  [employer] [facility] Dept. 1) [address] [place] 2) [address], [place] 3)  [address], [place] [phone]  [phone]   [facility] Child Abuse Hotline [phone] or [phone] (After Hours)       Follow up with cardiology  for further evaluation of chest pain. Avoid the use of recreational drugs. Return to the emergency department if you experience loss of consciousness, chest pain, vomiting, fever, rash on her hands and feet.

## 2015-08-15 NOTE — ED Notes (Signed)
Pt informed we need a urine sample  
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
## 2015-08-15 NOTE — ED Notes (Signed)
Pt encouraged to provide a urine sample

## 2015-08-15 NOTE — ED Notes (Signed)
Pt is in stable condition upon d/c and is escorted from ED via wheelchair. 

## 2015-08-16 NOTE — ED Provider Notes (Signed)
CSN: [id number]     Arrival date & time 08/15/15  1332 History   First MD Initiated Contact with Patient 08/15/15 1335     Chief Complaint  Patient presents with  . Chest Pain     (Consider location/radiation/quality/duration/timing/severity/associated sxs/prior Treatment) HPI  George Kelley is a 34 y.o F with a pmhx bipolar disorder, schizophrenia who presents the emergency department today complaining of syncope and chest pain. Patient states that he was kneeled on the floor praying at his [employer] when he sat up quickly and passed out. Patient states that he was out for approximately 20 minutes and when he came to he complained of central substernal chest pain that did not radiate. Patient had associated shortness of breath at this time. Patient states that upon onset pain was 8 out of 10. Now in the emergency department pain is 5 out of 10. Currently no shortness of breath. Patient states that he has passed out before in a similar event. Denies headache, blurry vision, confusion, numbness, tingling, weakness. Denies recreational drug use.  Past Medical History  Diagnosis Date  . Hypertension   . Asthma   . Bipolar affective (HCC)   . Schizophrenia [facility](HCC)    Past Surgical History  Procedure Laterality Date  . Left knee    . Head injury     History reviewed. No pertinent family history. Social History  Substance Use Topics  . Smoking status: Current Every Day Smoker -- 1.00 packs/day    Types: Cigarettes  . Smokeless tobacco: None  . Alcohol Use: Yes    Review of Systems  All other systems reviewed and are negative.     Allergies  Review of patient's allergies indicates no known allergies.  Home Medications   Prior to Admission medications   Medication Sig Start Date End Date Taking? Authorizing Provider  divalproex (DEPAKOTE ER) 500 MG 24 hr tablet Take 1,500 mg by mouth every evening. 07/28/15  Yes Historical Provider, MD  QUEtiapine (SEROQUEL) 400 MG tablet Take  800 mg by mouth every evening. 07/28/15  Yes Historical Provider, MD   BP 147/102 mmHg  Pulse 66  Temp(Src) 98.2 F (36.8 C) (Oral)  Resp 22  Ht 6\' 5"  (1.956 m)  Wt 202 lb (91.627 kg)  BMI 23.95 kg/m2  SpO2 97% Physical Exam  Constitutional: He is oriented to person, place, and time. He appears well-developed and well-nourished. No distress.  HENT:  Head: Normocephalic and atraumatic.  Mouth/Throat: Oropharynx is clear and moist. No oropharyngeal exudate.  Eyes: Conjunctivae and EOM are normal. Pupils are equal, round, and reactive to light. Right eye exhibits no discharge. Left eye exhibits no discharge. No scleral icterus.  Conjunctiva injected.  Neck: Normal range of motion. Neck supple.  Cardiovascular: Normal rate, regular rhythm, normal heart sounds and intact distal pulses.  Exam reveals no gallop and no friction rub.   No murmur heard. Pulmonary/Chest: Effort normal and breath sounds normal. No respiratory distress. He has no wheezes. He has no rales. He exhibits no tenderness.  Abdominal: Soft. Bowel sounds are normal. He exhibits no distension and no mass. There is no tenderness. There is no rebound and no guarding.  Musculoskeletal: Normal range of motion. He exhibits no edema.  Lymphadenopathy:    He has cervical adenopathy.  Neurological: He is alert and oriented to person, place, and time. No cranial nerve deficit.  Strength 5/5 throughout. No sensory deficits.  No gait abnormality.  Skin: Skin is warm and dry. No rash noted. He  is not diaphoretic. No erythema. No pallor.  Track marks noted in bilateral antecubital fossas.  Psychiatric:  Strange affect. Patient states he does not like men and will not allow them in his room.  Nursing note and vitals reviewed.   ED Course  Procedures (including critical care time) Labs Review Labs Reviewed  CBC WITH DIFFERENTIAL/PLATELET - Abnormal; Notable for the following:    MCV 102.5 (*)    MCH 34.9 (*)    All other  components within normal limits  URINALYSIS, ROUTINE W REFLEX MICROSCOPIC (NOT AT [facility] - Abnormal; Notable for the following:    Leukocytes, UA SMALL (*)    All other components within normal limits  URINE RAPID DRUG SCREEN, HOSP PERFORMED - Abnormal; Notable for the following:    Opiates POSITIVE (*)    Tetrahydrocannabinol POSITIVE (*)    All other components within normal limits  URINE MICROSCOPIC-ADD ON - Abnormal; Notable for the following:    Casts HYALINE CASTS (*)    All other components within normal limits  COMPREHENSIVE METABOLIC PANEL  I-STAT TROPOININ, ED  I-STAT TROPOININ, ED    Imaging Review Dg Chest 2 View  08/15/2015  CLINICAL DATA:  Acute onset of chest pain, history of tobacco use EXAM: CHEST - 2 VIEW COMPARISON:  05/31/2015 FINDINGS: Cardiac shadow is stable. The lungs are well aerated bilaterally. No focal infiltrate or sizable effusion is seen. IMPRESSION: No active disease. Electronically Signed   By: Alcide Clever M.D.   On: 08/15/2015 15:12   I have personally reviewed and evaluated these images and lab results as part of my medical decision-making.   EKG Interpretation   Date/Time:  Friday August 15 2015 13:39:50 EDT Ventricular Rate:  72 PR Interval:  135 QRS Duration: 94 QT Interval:  422 QTC Calculation: 462 R Axis:   33 Text Interpretation:  Sinus rhythm Borderline ST elevation, anterior leads  No significant change since last tracing Confirmed by ALLEN  MD, ANTHONY  ([id number]) on 08/15/2015 3:03:55 PM      MDM   Final diagnoses:  Atypical chest pain  Vasovagal syncope    34 year old male patient presents with syncope and chest pain. Patient states that he was knelt on the ground playing for an extended period of time when he sat up and passed out. When patient came to he experienced substernal chest pain.   No cardiac history. Patient has a low heart score. EKG within normal limits. No QT prolongation. Troponin and delta troponin negative.  Low suspicion ACS. Suspect the syncope is due to a vasovagal etiology. No neurological deficits. Patient able to ambulate without difficulty. No orthostasis.  Patient given morphine. Now is chest pain-free. We will discharge patient home with cardiology follow-up. Recommend that patient avoid recreational drug use. Recommend follow-up with primary care provider as well for further evaluation and possible medication adjustment. Patient given strict return precautions which are outlined in patient discharge instructions.  Patient was discussed with and seen by Dr. Freida Busman who agrees with the treatment plan.       Lester Kinsman [place], PA-C 08/16/15 1913  Lorre Nick, MD 08/26/15 [phone]

## 2015-08-28 ENCOUNTER — Encounter (HOSPITAL_COMMUNITY): Payer: Self-pay | Admitting: Emergency Medicine

## 2015-08-28 ENCOUNTER — Emergency Department (HOSPITAL_COMMUNITY)
Admission: EM | Admit: 2015-08-28 | Discharge: 2015-08-28 | Disposition: A | Discharge status: Home / Self Care | Payer: No Typology Code available for payment source | Attending: Emergency Medicine | Admitting: Emergency Medicine

## 2015-08-28 DIAGNOSIS — Y9389 Activity, other specified: Secondary | ICD-10-CM | POA: Diagnosis not present

## 2015-08-28 DIAGNOSIS — S4992XA Unspecified injury of left shoulder and upper arm, initial encounter: Secondary | ICD-10-CM | POA: Insufficient documentation

## 2015-08-28 DIAGNOSIS — S199XXA Unspecified injury of neck, initial encounter: Secondary | ICD-10-CM | POA: Insufficient documentation

## 2015-08-28 DIAGNOSIS — F319 Bipolar disorder, unspecified: Secondary | ICD-10-CM | POA: Insufficient documentation

## 2015-08-28 DIAGNOSIS — R454 Irritability and anger: Secondary | ICD-10-CM | POA: Diagnosis not present

## 2015-08-28 DIAGNOSIS — Y92410 Unspecified street and highway as the place of occurrence of the external cause: Secondary | ICD-10-CM | POA: Diagnosis not present

## 2015-08-28 DIAGNOSIS — Z79899 Other long term (current) drug therapy: Secondary | ICD-10-CM | POA: Diagnosis not present

## 2015-08-28 DIAGNOSIS — M25512 Pain in left shoulder: Secondary | ICD-10-CM

## 2015-08-28 DIAGNOSIS — Y998 Other external cause status: Secondary | ICD-10-CM | POA: Diagnosis not present

## 2015-08-28 DIAGNOSIS — F209 Schizophrenia, unspecified: Secondary | ICD-10-CM | POA: Insufficient documentation

## 2015-08-28 DIAGNOSIS — J45909 Unspecified asthma, uncomplicated: Secondary | ICD-10-CM | POA: Insufficient documentation

## 2015-08-28 DIAGNOSIS — I10 Essential (primary) hypertension: Secondary | ICD-10-CM | POA: Insufficient documentation

## 2015-08-28 DIAGNOSIS — Z720 Tobacco use: Secondary | ICD-10-CM | POA: Diagnosis not present

## 2015-08-28 DIAGNOSIS — M542 Cervicalgia: Secondary | ICD-10-CM

## 2015-08-28 MED ORDER — METHOCARBAMOL 500 MG PO TABS: 500.0000 mg | ORAL_TABLET | Freq: Two times a day (BID) | ORAL | Status: DC | PRN

## 2015-08-28 MED ORDER — OXYCODONE-ACETAMINOPHEN 5-325 MG PO TABS
1.0000 | ORAL_TABLET | Freq: Once | ORAL | Status: AC
  Administered 2015-08-28: 1 via ORAL
  Filled 2015-08-28: qty 1

## 2015-08-28 MED ORDER — NAPROXEN 250 MG PO TABS: 250.0000 mg | ORAL_TABLET | Freq: Two times a day (BID) | ORAL | Status: DC

## 2015-08-28 MED ORDER — ONDANSETRON 4 MG PO TBDP
4.0000 mg | ORAL_TABLET | Freq: Once | ORAL | Status: AC
  Administered 2015-08-28: 4 mg via ORAL
  Filled 2015-08-28: qty 1
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
## 2015-08-30 ENCOUNTER — Encounter (HOSPITAL_COMMUNITY): Payer: Self-pay | Admitting: Emergency Medicine

## 2015-08-30 ENCOUNTER — Emergency Department (HOSPITAL_COMMUNITY)
Admission: EM | Admit: 2015-08-30 | Discharge: 2015-08-30 | Disposition: A | Discharge status: Home / Self Care | Payer: Medicare Other | Attending: Emergency Medicine | Admitting: Emergency Medicine

## 2015-08-30 DIAGNOSIS — I10 Essential (primary) hypertension: Secondary | ICD-10-CM | POA: Diagnosis not present

## 2015-08-30 DIAGNOSIS — Z79899 Other long term (current) drug therapy: Secondary | ICD-10-CM | POA: Insufficient documentation

## 2015-08-30 DIAGNOSIS — Z720 Tobacco use: Secondary | ICD-10-CM | POA: Insufficient documentation

## 2015-08-30 DIAGNOSIS — Z791 Long term (current) use of non-steroidal anti-inflammatories (NSAID): Secondary | ICD-10-CM | POA: Insufficient documentation

## 2015-08-30 DIAGNOSIS — J45909 Unspecified asthma, uncomplicated: Secondary | ICD-10-CM | POA: Diagnosis not present

## 2015-08-30 DIAGNOSIS — M542 Cervicalgia: Secondary | ICD-10-CM | POA: Diagnosis not present

## 2015-08-30 DIAGNOSIS — F319 Bipolar disorder, unspecified: Secondary | ICD-10-CM | POA: Diagnosis not present

## 2015-08-30 DIAGNOSIS — F209 Schizophrenia, unspecified: Secondary | ICD-10-CM | POA: Diagnosis not present

## 2015-08-30 MED ORDER — CYCLOBENZAPRINE HCL 10 MG PO TABS: 10.0000 mg | ORAL_TABLET | Freq: Two times a day (BID) | ORAL | Status: DC | PRN

## 2015-08-30 MED ORDER — CYCLOBENZAPRINE HCL 10 MG PO TABS
10.0000 mg | ORAL_TABLET | Freq: Once | ORAL | Status: DC
  Filled 2015-08-30: qty 1

## 2015-08-30 MED ORDER — IBUPROFEN 800 MG PO TABS: 800.0000 mg | ORAL_TABLET | Freq: Three times a day (TID) | ORAL | Status: DC

## 2015-08-30 MED ORDER — IBUPROFEN 800 MG PO TABS
800.0000 mg | ORAL_TABLET | Freq: Once | ORAL | Status: AC
  Administered 2015-08-30: 800 mg via ORAL
  Filled 2015-08-30: qty 1

## 2015-08-30 NOTE — Discharge Instructions (Signed)
1. Medications: ibuprofen, flexeril, usual home medications 2. Treatment: rest, drink plenty of fluids, ice or apply heat 3. Follow Up: please followup with your primary doctor this week for discussion of your diagnoses and further evaluation after today's visit; if you do not have a primary care doctor use the resource guide provided to find one; please return to the ER for severe pain, numbness, weakness, new or worsening symptoms   Cervical Strain and Sprain With Rehab Cervical strain and sprain are injuries that commonly occur with "whiplash" injuries. Whiplash occurs when the neck is forcefully whipped backward or forward, such as during a motor vehicle accident or during contact sports. The muscles, ligaments, tendons, discs, and nerves of the neck are susceptible to injury when this occurs. RISK FACTORS Risk of having a whiplash injury increases if:  Osteoarthritis of the spine.  Situations that make head or neck accidents or trauma more likely.  High-risk sports (football, rugby, wrestling, hockey, auto racing, gymnastics, diving, contact karate, or boxing).  Poor strength and flexibility of the neck.  Previous neck injury.  Poor tackling technique.  Improperly fitted or padded equipment. SYMPTOMS   Pain or stiffness in the front or back of neck or both.  Symptoms may present immediately or up to 24 hours after injury.  Dizziness, headache, nausea, and vomiting.  Muscle spasm with soreness and stiffness in the neck.  Tenderness and swelling at the injury site. PREVENTION  Learn and use proper technique (avoid tackling with the head, spearing, and head-butting; use proper falling techniques to avoid landing on the head).  Warm up and stretch properly before activity.  Maintain physical fitness:  Strength, flexibility, and endurance.  Cardiovascular fitness.  Wear properly fitted and padded protective equipment, such as padded soft collars, for participation in  contact sports. PROGNOSIS  Recovery from cervical strain and sprain injuries is dependent on the extent of the injury. These injuries are usually curable in 1 week to 3 months with appropriate treatment.  RELATED COMPLICATIONS   Temporary numbness and weakness may occur if the nerve roots are damaged, and this may persist until the nerve has completely healed.  Chronic pain due to frequent recurrence of symptoms.  Prolonged healing, especially if activity is resumed too soon (before complete recovery). TREATMENT  Treatment initially involves the use of ice and medication to help reduce pain and inflammation. It is also important to perform strengthening and stretching exercises and modify activities that worsen symptoms so the injury does not get worse. These exercises may be performed at home or with a therapist. For patients who experience severe symptoms, a soft, padded collar may be recommended to be worn around the neck.  Improving your posture may help reduce symptoms. Posture improvement includes pulling your chin and abdomen in while sitting or standing. If you are sitting, sit in a firm chair with your buttocks against the back of the chair. While sleeping, try replacing your pillow with a small towel rolled to 2 inches in diameter, or use a cervical pillow or soft cervical collar. Poor sleeping positions delay healing.  For patients with nerve root damage, which causes numbness or weakness, the use of a cervical traction apparatus may be recommended. Surgery is rarely necessary for these injuries. However, cervical strain and sprains that are present at birth (congenital) may require surgery. MEDICATION   If pain medication is necessary, nonsteroidal anti-inflammatory medications, such as aspirin and ibuprofen, or other minor pain relievers, such as acetaminophen, are often recommended.  Do  not take pain medication for 7 days before surgery.  Prescription pain relievers may be given if  deemed necessary by your caregiver. Use only as directed and only as much as you need. HEAT AND COLD:   Cold treatment (icing) relieves pain and reduces inflammation. Cold treatment should be applied for 10 to 15 minutes every 2 to 3 hours for inflammation and pain and immediately after any activity that aggravates your symptoms. Use ice packs or an ice massage.  Heat treatment may be used prior to performing the stretching and strengthening activities prescribed by your caregiver, physical therapist, or athletic trainer. Use a heat pack or a warm soak. SEEK MEDICAL CARE IF:   Symptoms get worse or do not improve in 2 weeks despite treatment.  New, unexplained symptoms develop (drugs used in treatment may produce side effects). EXERCISES RANGE OF MOTION (ROM) AND STRETCHING EXERCISES - Cervical Strain and Sprain These exercises may help you when beginning to rehabilitate your injury. In order to successfully resolve your symptoms, you must improve your posture. These exercises are designed to help reduce the forward-head and rounded-shoulder posture which contributes to this condition. Your symptoms may resolve with or without further involvement from your physician, physical therapist or athletic trainer. While completing these exercises, remember:   Restoring tissue flexibility helps normal motion to return to the joints. This allows healthier, less painful movement and activity.  An effective stretch should be held for at least 20 seconds, although you may need to begin with shorter hold times for comfort.  A stretch should never be painful. You should only feel a gentle lengthening or release in the stretched tissue. STRETCH- Axial Extensors  Lie on your back on the floor. You may bend your knees for comfort. Place a rolled-up hand towel or dish towel, about 2 inches in diameter, under the part of your head that makes contact with the floor.  Gently tuck your chin, as if trying to make a  "double chin," until you feel a gentle stretch at the base of your head.  Hold __________ seconds. Repeat __________ times. Complete this exercise __________ times per day.  STRETCH - Axial Extension   Stand or sit on a firm surface. Assume a good posture: chest up, shoulders drawn back, abdominal muscles slightly tense, knees unlocked (if standing) and feet hip width apart.  Slowly retract your chin so your head slides back and your chin slightly lowers. Continue to look straight ahead.  You should feel a gentle stretch in the back of your head. Be certain not to feel an aggressive stretch since this can cause headaches later.  Hold for __________ seconds. Repeat __________ times. Complete this exercise __________ times per day. STRETCH - Cervical Side Bend   Stand or sit on a firm surface. Assume a good posture: chest up, shoulders drawn back, abdominal muscles slightly tense, knees unlocked (if standing) and feet hip width apart.  Without letting your nose or shoulders move, slowly tip your right / left ear to your shoulder until your feel a gentle stretch in the muscles on the opposite side of your neck.  Hold __________ seconds. Repeat __________ times. Complete this exercise __________ times per day. STRETCH - Cervical Rotators   Stand or sit on a firm surface. Assume a good posture: chest up, shoulders drawn back, abdominal muscles slightly tense, knees unlocked (if standing) and feet hip width apart.  Keeping your eyes level with the ground, slowly turn your head until you feel a  gentle stretch along the back and opposite side of your neck.  Hold __________ seconds. Repeat __________ times. Complete this exercise __________ times per day. RANGE OF MOTION - Neck Circles   Stand or sit on a firm surface. Assume a good posture: chest up, shoulders drawn back, abdominal muscles slightly tense, knees unlocked (if standing) and feet hip width apart.  Gently roll your head down and  around from the back of one shoulder to the back of the other. The motion should never be forced or painful.  Repeat the motion 10-20 times, or until you feel the neck muscles relax and loosen. Repeat __________ times. Complete the exercise __________ times per day. STRENGTHENING EXERCISES - Cervical Strain and Sprain These exercises may help you when beginning to rehabilitate your injury. They may resolve your symptoms with or without further involvement from your physician, physical therapist, or athletic trainer. While completing these exercises, remember:   Muscles can gain both the endurance and the strength needed for everyday activities through controlled exercises.  Complete these exercises as instructed by your physician, physical therapist, or athletic trainer. Progress the resistance and repetitions only as guided.  You may experience muscle soreness or fatigue, but the pain or discomfort you are trying to eliminate should never worsen during these exercises. If this pain does worsen, stop and make certain you are following the directions exactly. If the pain is still present after adjustments, discontinue the exercise until you can discuss the trouble with your clinician. STRENGTH - Cervical Flexors, Isometric  Face a wall, standing about 6 inches away. Place a small pillow, a ball about 6-8 inches in diameter, or a folded towel between your forehead and the wall.  Slightly tuck your chin and gently push your forehead into the soft object. Push only with mild to moderate intensity, building up tension gradually. Keep your jaw and forehead relaxed.  Hold 10 to 20 seconds. Keep your breathing relaxed.  Release the tension slowly. Relax your neck muscles completely before you start the next repetition. Repeat __________ times. Complete this exercise __________ times per day. STRENGTH- Cervical Lateral Flexors, Isometric   Stand about 6 inches away from a wall. Place a small pillow, a  ball about 6-8 inches in diameter, or a folded towel between the side of your head and the wall.  Slightly tuck your chin and gently tilt your head into the soft object. Push only with mild to moderate intensity, building up tension gradually. Keep your jaw and forehead relaxed.  Hold 10 to 20 seconds. Keep your breathing relaxed.  Release the tension slowly. Relax your neck muscles completely before you start the next repetition. Repeat __________ times. Complete this exercise __________ times per day. STRENGTH - Cervical Extensors, Isometric   Stand about 6 inches away from a wall. Place a small pillow, a ball about 6-8 inches in diameter, or a folded towel between the back of your head and the wall.  Slightly tuck your chin and gently tilt your head back into the soft object. Push only with mild to moderate intensity, building up tension gradually. Keep your jaw and forehead relaxed.  Hold 10 to 20 seconds. Keep your breathing relaxed.  Release the tension slowly. Relax your neck muscles completely before you start the next repetition. Repeat __________ times. Complete this exercise __________ times per day. POSTURE AND BODY MECHANICS CONSIDERATIONS - Cervical Strain and Sprain Keeping correct posture when sitting, standing or completing your activities will reduce the stress put on different  body tissues, allowing injured tissues a chance to heal and limiting painful experiences. The following are general guidelines for improved posture. Your physician or physical therapist will provide you with any instructions specific to your needs. While reading these guidelines, remember:  The exercises prescribed by your provider will help you have the flexibility and strength to maintain correct postures.  The correct posture provides the optimal environment for your joints to work. All of your joints have less wear and tear when properly supported by a spine with good posture. This means you will  experience a healthier, less painful body.  Correct posture must be practiced with all of your activities, especially prolonged sitting and standing. Correct posture is as important when doing repetitive low-stress activities (typing) as it is when doing a single heavy-load activity (lifting). PROLONGED STANDING WHILE SLIGHTLY LEANING FORWARD When completing a task that requires you to lean forward while standing in one place for a long time, place either foot up on a stationary 2- to 4-inch high object to help maintain the best posture. When both feet are on the ground, the low back tends to lose its slight inward curve. If this curve flattens (or becomes too large), then the back and your other joints will experience too much stress, fatigue more quickly, and can cause pain.  RESTING POSITIONS Consider which positions are most painful for you when choosing a resting position. If you have pain with flexion-based activities (sitting, bending, stooping, squatting), choose a position that allows you to rest in a less flexed posture. You would want to avoid curling into a fetal position on your side. If your pain worsens with extension-based activities (prolonged standing, working overhead), avoid resting in an extended position such as sleeping on your stomach. Most people will find more comfort when they rest with their spine in a more neutral position, neither too rounded nor too arched. Lying on a non-sagging bed on your side with a pillow between your knees, or on your back with a pillow under your knees will often provide some relief. Keep in mind, being in any one position for a prolonged period of time, no matter how correct your posture, can still lead to stiffness. WALKING Walk with an upright posture. Your ears, shoulders, and hips should all line up. OFFICE WORK When working at a desk, create an environment that supports good, upright posture. Without extra support, muscles fatigue and lead to  excessive strain on joints and other tissues. CHAIR:  A chair should be able to slide under your desk when your back makes contact with the back of the chair. This allows you to work closely.  The chair's height should allow your eyes to be level with the upper part of your monitor and your hands to be slightly lower than your elbows.  Body position:  Your feet should make contact with the floor. If this is not possible, use a foot rest.  Keep your ears over your shoulders. This will reduce stress on your neck and low back.   This information is not intended to replace advice given to you by your health care provider. Make sure you discuss any questions you have with your health care provider.   Document Released: 10/11/2005 Document Revised: 11/01/2014 Document Reviewed: 01/23/2009 Elsevier Interactive Patient Education 2016 [employer]   Emergency Department Resource Guide 1) Find a Doctor and Pay Out of Pocket Although you won't have to find out who is covered by your insurance plan, it is  a good idea to ask around and get recommendations. You will then need to call the office and see if the doctor you have chosen will accept you as a new patient and what types of options they offer for patients who are self-pay. Some doctors offer discounts or will set up payment plans for their patients who do not have insurance, but you will need to ask so you aren't surprised when you get to your appointment.  2) Contact Your Local Health Department Not all health departments have doctors that can see patients for sick visits, but many do, so it is worth a call to see if yours does. If you don't know where your local health department is, you can check in your phone book. The CDC also has a tool to help you locate your state's health department, and many state websites also have listings of all of their local health departments.  3) Find a Walk-in Clinic If your illness is not likely to be very  severe or complicated, you may want to try a walk in clinic. These are popping up all over the country in pharmacies, drugstores, and shopping centers. They're usually staffed by nurse practitioners or physician assistants that have been trained to treat common illnesses and complaints. They're usually fairly quick and inexpensive. However, if you have serious medical issues or chronic medical problems, these are probably not your best option.  No Primary Care Doctor: - Call Health Connect at  [phone] - they can help you locate a primary care doctor that  accepts your insurance, provides certain services, etc. - Physician Referral Service- [phone]  Chronic Pain Problems: Organization         Address  Phone   Notes  [facility]  [phone] Patients need to be referred by their primary care doctor.   Medication Assistance: Organization         Address  Phone   Notes  [facility] [address] [place] [place], [place] [address] [phone] --Must be a resident of [facility] -- Must have NO insurance coverage whatsoever (no Medicaid/ Medicare, etc.) -- The pt. MUST have a primary care doctor that directs their care regularly and follows them in the community   [facility]  [phone]   [employer]  [phone]    Agencies that provide inexpensive medical care: Organization         Address  Phone   Notes  [facility]  [phone]   [facility]    [phone]   [facility] [address] [place], [place] [address] [phone]   [facility] [address], [place] [phone]   [facility]anned Parenthood    [phone]   [facility]    [phone]   [facility]  [address], [place] Phone:  [phone], Fax:  [phone] Hours of Operation:  9 am - 6 pm, M-F.  Also accepts  Medicaid/Medicare and self-pay.  [facility]  [address], [place] Phone: [phone], Fax: [phone]. Hours of Operation:  8:30 am - 5:30 pm, M-F.  Also accepts Medicaid and self-pay.  [facility] [place] [address] [place] Phone: [phone]   [facility] [address] Natasha Bence [place], [place] [phone], Ext. [phone] Mondays & Thursdays: 7-9 AM.  First 15 patients are seen on a first come, first serve basis.    Medicaid-accepting [facility] Providers:  Organization         Address  Phone   Notes  [facility] [address], [place] [phone] Also accepts self-pay patients.  [facility] [address] Laurell Josephs [place], [place]  [phone]   [facility] [address], [place] [phone]   [facility] [address], [place] [phone]   Renaye Rakers [address], [place]   [phone] Only accepts [place] [facility] [place] patients after they have their name applied to their card.   Self-Pay (no insurance) in [facility]:  Organization         Address  Phone   Notes  Sickle Cell Patients, [facility] [address] [place], [place] [phone]   [facility] [address] [place], [place] [phone]   [facility] [place]  [address], [phone]   [facility]/Dr. Osei-Bonsu  [address] [place] or [address], [place] [phone] Phone number for both [place] and [place] locations is the same.  [facility] [address] [place] [phone]   [facility] [address], [place] or [address] [phone]   [facility] [address] [place] [phone], phone; [phone], fax Sees patients 1st and 3rd Saturday of every month.  Must not qualify for public or private insurance (i.e. Medicaid, Medicare, [place] Health Choice, Veterans' Benefits)  Household income should be no more than 200% of the poverty level The clinic cannot treat you if you are pregnant or think you are pregnant  Sexually transmitted diseases are not treated at the clinic.    Dental Care: Organization         Address  Phone  Notes  [facility] of [facility] [address] [place], [place] [phone] Accepts children up to age 54 who are enrolled in [place] or [place] Health Choice; pregnant women with a Medicaid card; and children who have applied for Medicaid or [place] Health Choice, but were declined, whose parents can pay a reduced fee at time of service.  [facility] Department of [facility]  [address], [place] [phone] Accepts children up to age 61 who are enrolled in [place] or [place] Health Choice; pregnant women with a Medicaid card; and children who have applied for Medicaid or [place] Health Choice, but were declined, whose parents can pay a reduced fee at time of service.  [facility]  [address] [place], [place] [phone] Patients are seen by appointment only. Walk-ins are not accepted. [facility] will see patients 61 years of age and older. Monday - Tuesday (8am-5pm) Most Wednesdays (8:30-5pm) $30 per visit, cash only  [facility] Adult Dental Access [facility]  [address], [facility] [phone] Patients are seen by appointment only. Walk-ins are not accepted. [facility] will see patients 57 years of age and older. One Wednesday Evening (Monthly: Volunteer Based).  $30 per visit, cash only  [employer]  [phone] for adults; Children under age 2, call Graduate Pediatric  Dentistry at [phone]. Children aged  89-14, please call [phone] to request a pediatric application.  Dental services are provided in all areas of dental care including fillings, crowns and bridges, complete and partial dentures, implants, gum treatment, root canals, and extractions. Preventive care is also provided. Treatment is provided to both adults and children. Patients are selected via a lottery and there is often a waiting list.   [facility] [address] [place]  [phone] [email or web address]   [facility] [address] [place], [place] [phone], Ext. [phone] Second and Fourth Thursday of each month, opens at 6:30 AM; Clinic ends at 9 AM.  Patients are seen on a first-come first-served basis, and a limited number are seen during each clinic.   [facility]  [address] Ether Griffins [place], [place] [phone]   Eligibility Requirements You must have lived in [place], [place], or [place] counties for at least the last three months.   You cannot be eligible for state or federal sponsored [place], including [employer], [place], or [employer].   You generally cannot be eligible for healthcare insurance through your employer.    How to apply: Eligibility screenings are held every Tuesday and Wednesday afternoon from 1:00 pm until 4:00 pm. You do not need an appointment for the interview!  [facility] [address] [place], [place] [phone]   [facility]  [phone]   [facility] Health Department  [phone]   [facility]  [phone]    Behavioral Health Resources in the Community: Intensive Outpatient Programs Organization         Address  Phone  Notes  [facility] [address], [place], [place] [phone]   [facility] [address] [place], [place] [phone]   [facility] [address],  [place], [place]  [phone]   [facility] [address],  [place], [place] [phone] or [phone]   Substance Abuse Resources Organization         Address  Phone  Notes  [facility] Services  [phone]   [facility]  [phone]   [facility] [place]  [phone]   Floydene Flock  [phone]   [facility] Abuse [facility]  [phone]   Psychological Services Organization         Address  Phone  Notes  [facility]  [phone]   [facility]  [phone]   [facility] [address] [place] [phone] or [phone]    Mobile Crisis Teams Organization         Address  Phone  Notes  [facility], Mobile Crisis Care Unit  [phone]   [facility]  [address] [place], [place] [phone]   Doristine Locks [address] [place] [phone]    Self-Help/Support Groups Organization         Address  Phone             Notes  [facility] [place] - variety of support groups  [phone] Call for more information  Narcotics Anonymous (NA), [facility] [address], [employer] [place]  2 meetings at this location   Statistician         Address  Phone  Notes  [facility] [address],  [place]  [phone]   [facility]  [address] [place] [address], [place], [place] [phone]   [facility] [address] [place], [place] [phone] Admissions: 8am-3pm M-F  [facility] [address],    [place], [place] [phone]   [facility] [address] [place], [place], [place] [phone]   [facility] [address]  [place], [place] [phone]   [facility] - Intensive Outpatient [address], [place], [place] [phone]   [facility]  ([facility] [address] [place]  [place], [place] [phone] or [phone]   [facility] ([facility]) [address] [place], [place] [phone] Accepts Medicaid  [facility] [address]  [place] [phone] Substance Abuse/Addiction Treatment   [facility] Organization         Address  Phone  Notes  [facility]  [phone]   Angie Fava, PhD [address] [place], [place]   [phone] or [phone]   [facility]al   [address] [place], [place] [phone]   [facility] [address], [place], [place] [phone] Insurance/Medicaid/sponsorship through [facility] and Families [address]                                    [place], [place] [phone] Therapy/tele-psych/case  [facility] [address], [place] [phone]    Dr. Lolly Mustache  [phone]   [facility]  [employer] [facility] Dept. 1) [address], [place] 2) [address] [place] 3)  [address], [place] [phone]  [phone]   [facility] Child Abuse Hotline [phone] or [phone] (After Hours)
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
## 2015-12-12 ENCOUNTER — Emergency Department (HOSPITAL_COMMUNITY): Payer: Medicare Other

## 2015-12-12 ENCOUNTER — Emergency Department (HOSPITAL_COMMUNITY)
Admission: EM | Admit: 2015-12-12 | Discharge: 2015-12-12 | Disposition: A | Discharge status: Home / Self Care | Payer: Medicare Other | Attending: Emergency Medicine | Admitting: Emergency Medicine

## 2015-12-12 ENCOUNTER — Encounter (HOSPITAL_COMMUNITY): Payer: Self-pay | Admitting: Emergency Medicine

## 2015-12-12 DIAGNOSIS — M79641 Pain in right hand: Secondary | ICD-10-CM | POA: Diagnosis not present

## 2015-12-12 DIAGNOSIS — J45909 Unspecified asthma, uncomplicated: Secondary | ICD-10-CM | POA: Diagnosis not present

## 2015-12-12 DIAGNOSIS — M25562 Pain in left knee: Secondary | ICD-10-CM | POA: Diagnosis not present

## 2015-12-12 DIAGNOSIS — F17210 Nicotine dependence, cigarettes, uncomplicated: Secondary | ICD-10-CM | POA: Insufficient documentation

## 2015-12-12 DIAGNOSIS — I10 Essential (primary) hypertension: Secondary | ICD-10-CM | POA: Diagnosis not present

## 2015-12-12 DIAGNOSIS — R51 Headache: Secondary | ICD-10-CM | POA: Diagnosis not present

## 2015-12-12 DIAGNOSIS — R079 Chest pain, unspecified: Secondary | ICD-10-CM | POA: Insufficient documentation

## 2015-12-12 LAB — CBC
HCT: 45 % (ref 39.0–52.0)
Hemoglobin: 15.2 g/dL (ref 13.0–17.0)
MCH: 34.2 pg — ABNORMAL HIGH (ref 26.0–34.0)
MCHC: 33.8 g/dL (ref 30.0–36.0)
MCV: 101.4 fL — AB (ref 78.0–100.0)
PLATELETS: 210 10*3/uL (ref 150–400)
RBC: 4.44 MIL/uL (ref 4.22–5.81)
RDW: 11.8 % (ref 11.5–15.5)
WBC: 6.6 10*3/uL (ref 4.0–10.5)

## 2015-12-12 LAB — BASIC METABOLIC PANEL
Anion gap: 10 (ref 5–15)
BUN: 17 mg/dL (ref 6–20)
CALCIUM: 9.5 mg/dL (ref 8.9–10.3)
CHLORIDE: 108 mmol/L (ref 101–111)
CO2: 25 mmol/L (ref 22–32)
CREATININE: 1.33 mg/dL — AB (ref 0.61–1.24)
Glucose, Bld: 88 mg/dL (ref 65–99)
Potassium: 4.5 mmol/L (ref 3.5–5.1)
SODIUM: 143 mmol/L (ref 135–145)

## 2015-12-12 LAB — I-STAT TROPONIN, ED: TROPONIN I, POC: 0 ng/mL (ref 0.00–0.08)

## 2015-12-12 NOTE — ED Notes (Signed)
Per pt, states chest pain while laying on the cuch-left knee pain, right hand pain and a headache

## 2015-12-12 NOTE — ED Notes (Signed)
Called x 3 in the waiting room w/o answer.

## 2015-12-12 NOTE — ED Notes (Signed)
Called x 1 w/o answer.  

## 2016-03-08 ENCOUNTER — Emergency Department (HOSPITAL_COMMUNITY)
Admission: EM | Admit: 2016-03-08 | Discharge: 2016-03-09 | Disposition: A | Discharge status: Other Acute Inpatient Hospital | Payer: Medicare Other | Attending: Emergency Medicine | Admitting: Emergency Medicine

## 2016-03-08 ENCOUNTER — Encounter (HOSPITAL_COMMUNITY): Payer: Self-pay | Admitting: Emergency Medicine

## 2016-03-08 DIAGNOSIS — Z79899 Other long term (current) drug therapy: Secondary | ICD-10-CM | POA: Diagnosis not present

## 2016-03-08 DIAGNOSIS — I10 Essential (primary) hypertension: Secondary | ICD-10-CM | POA: Diagnosis not present

## 2016-03-08 DIAGNOSIS — J45909 Unspecified asthma, uncomplicated: Secondary | ICD-10-CM | POA: Diagnosis not present

## 2016-03-08 DIAGNOSIS — R45850 Homicidal ideations: Secondary | ICD-10-CM | POA: Diagnosis not present

## 2016-03-08 DIAGNOSIS — F209 Schizophrenia, unspecified: Secondary | ICD-10-CM | POA: Diagnosis not present

## 2016-03-08 DIAGNOSIS — F17210 Nicotine dependence, cigarettes, uncomplicated: Secondary | ICD-10-CM | POA: Diagnosis not present

## 2016-03-08 DIAGNOSIS — Z7951 Long term (current) use of inhaled steroids: Secondary | ICD-10-CM | POA: Diagnosis not present

## 2016-03-08 DIAGNOSIS — Z791 Long term (current) use of non-steroidal anti-inflammatories (NSAID): Secondary | ICD-10-CM | POA: Diagnosis not present

## 2016-03-08 DIAGNOSIS — F319 Bipolar disorder, unspecified: Secondary | ICD-10-CM | POA: Insufficient documentation

## 2016-03-08 DIAGNOSIS — F312 Bipolar disorder, current episode manic severe with psychotic features: Secondary | ICD-10-CM | POA: Diagnosis not present

## 2016-03-08 DIAGNOSIS — F911 Conduct disorder, childhood-onset type: Secondary | ICD-10-CM | POA: Diagnosis not present

## 2016-03-08 LAB — BASIC METABOLIC PANEL
ANION GAP: 6 (ref 5–15)
BUN: 13 mg/dL (ref 6–20)
CHLORIDE: 106 mmol/L (ref 101–111)
CO2: 27 mmol/L (ref 22–32)
CREATININE: 1.16 mg/dL (ref 0.61–1.24)
Calcium: 9.4 mg/dL (ref 8.9–10.3)
GFR calc non Af Amer: >60 mL/min (ref >60)
Glucose, Bld: 94 mg/dL (ref 65–99)
POTASSIUM: 4.2 mmol/L (ref 3.5–5.1)
SODIUM: 139 mmol/L (ref 135–145)

## 2016-03-08 LAB — RAPID URINE DRUG SCREEN, HOSP PERFORMED
AMPHETAMINES: NOT DETECTED
BENZODIAZEPINES: NOT DETECTED
Barbiturates: NOT DETECTED
Cocaine: NOT DETECTED
OPIATES: NOT DETECTED
Tetrahydrocannabinol: POSITIVE — AB

## 2016-03-08 LAB — CBC
HCT: 46.1 % (ref 39.0–52.0)
HEMOGLOBIN: 15.8 g/dL (ref 13.0–17.0)
MCH: 34.7 pg — ABNORMAL HIGH (ref 26.0–34.0)
MCHC: 34.3 g/dL (ref 30.0–36.0)
MCV: 101.3 fL — ABNORMAL HIGH (ref 78.0–100.0)
Platelets: 225 10*3/uL (ref 150–400)
RBC: 4.55 MIL/uL (ref 4.22–5.81)
RDW: 12.3 % (ref 11.5–15.5)
WBC: 6.9 10*3/uL (ref 4.0–10.5)

## 2016-03-08 LAB — ETHANOL

## 2016-03-08 MED ORDER — NICOTINE 21 MG/24HR TD PT24
21.0000 mg | MEDICATED_PATCH | Freq: Every day | TRANSDERMAL | Status: DC
  Administered 2016-03-08 – 2016-03-09 (×2): 21 mg via TRANSDERMAL
  Filled 2016-03-08 (×2): qty 1

## 2016-03-08 MED ORDER — QUETIAPINE FUMARATE 300 MG PO TABS
800.0000 mg | ORAL_TABLET | Freq: Every evening | ORAL | Status: DC
  Administered 2016-03-08: 800 mg via ORAL
  Filled 2016-03-08 (×2): qty 2

## 2016-03-08 MED ORDER — QUETIAPINE FUMARATE 300 MG PO TABS: 800.0000 mg | ORAL_TABLET | Freq: Every evening | ORAL | Status: DC

## 2016-03-08 MED ORDER — DIVALPROEX SODIUM ER 500 MG PO TB24
1500.0000 mg | ORAL_TABLET | Freq: Every evening | ORAL | Status: DC
  Administered 2016-03-08: 1500 mg via ORAL
  Filled 2016-03-08: qty 3
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
## 2016-03-08 NOTE — Progress Notes (Signed)
Medicaid [place] access response hx indicates the [facility] pcp [facility] [place] [address] [place], [place] [phone]

## 2016-03-08 NOTE — ED Notes (Signed)
Patient came to the unit ambulatory.  He was oriented to the unit and to his surroundings.  He has been pleasant and cooperative with all staff thus far.  He does state that he prefers to deal with females.  Denies thoughts of harm to self or others.  Denies hallucinations.  He has eaten dinner and is currently visiting with his wife.

## 2016-03-08 NOTE — ED Notes (Addendum)
Patient denies SI, HI and AVH at this time. Plan of care discussed with patient. Patient voices no complaints or concerns at this time. Encouragement and support provided and safety maintain. Q 15 min safety checks remain in place.  

## 2016-03-08 NOTE — ED Notes (Signed)
Bed: WBH43 Expected date:  Expected time:  Means of arrival:  Comments: Triage 4 
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
## 2016-03-09 ENCOUNTER — Inpatient Hospital Stay (HOSPITAL_COMMUNITY)
Admission: AD | Admit: 2016-03-09 | Discharge: 2016-03-12 | DRG: 885 | Disposition: A | Discharge status: Home / Self Care | Payer: Medicare Other | Source: Intra-hospital | Attending: Psychiatry | Admitting: Psychiatry

## 2016-03-09 ENCOUNTER — Encounter (HOSPITAL_COMMUNITY): Payer: Self-pay | Admitting: *Deleted

## 2016-03-09 DIAGNOSIS — F312 Bipolar disorder, current episode manic severe with psychotic features: Secondary | ICD-10-CM | POA: Diagnosis not present

## 2016-03-09 DIAGNOSIS — F319 Bipolar disorder, unspecified: Secondary | ICD-10-CM

## 2016-03-09 DIAGNOSIS — F250 Schizoaffective disorder, bipolar type: Principal | ICD-10-CM | POA: Diagnosis present

## 2016-03-09 DIAGNOSIS — F17210 Nicotine dependence, cigarettes, uncomplicated: Secondary | ICD-10-CM | POA: Diagnosis present

## 2016-03-09 DIAGNOSIS — R45850 Homicidal ideations: Secondary | ICD-10-CM | POA: Diagnosis present

## 2016-03-09 DIAGNOSIS — I10 Essential (primary) hypertension: Secondary | ICD-10-CM | POA: Diagnosis present

## 2016-03-09 DIAGNOSIS — F911 Conduct disorder, childhood-onset type: Secondary | ICD-10-CM | POA: Diagnosis not present

## 2016-03-09 MED ORDER — TRAZODONE HCL 100 MG PO TABS
100.0000 mg | ORAL_TABLET | Freq: Every day | ORAL | Status: DC
  Filled 2016-03-09 (×2): qty 1

## 2016-03-09 MED ORDER — ACETAMINOPHEN 325 MG PO TABS: 650.0000 mg | ORAL_TABLET | Freq: Four times a day (QID) | ORAL | Status: DC | PRN

## 2016-03-09 MED ORDER — ALUM & MAG HYDROXIDE-SIMETH 200-200-20 MG/5ML PO SUSP: 30.0000 mL | ORAL | Status: DC | PRN

## 2016-03-09 MED ORDER — TRAZODONE HCL 100 MG PO TABS
100.0000 mg | ORAL_TABLET | Freq: Every day | ORAL | Status: DC
  Filled 2016-03-09: qty 1

## 2016-03-09 MED ORDER — DIVALPROEX SODIUM ER 500 MG PO TB24
500.0000 mg | ORAL_TABLET | Freq: Every day | ORAL | Status: DC
  Administered 2016-03-09: 500 mg via ORAL
  Filled 2016-03-09: qty 1

## 2016-03-09 MED ORDER — QUETIAPINE FUMARATE 400 MG PO TABS
800.0000 mg | ORAL_TABLET | Freq: Every evening | ORAL | Status: DC
Start: 2016-03-10 — End: 2016-03-11
  Administered 2016-03-10: 800 mg via ORAL
  Filled 2016-03-09 (×2): qty 2

## 2016-03-09 MED ORDER — DIVALPROEX SODIUM ER 500 MG PO TB24
500.0000 mg | ORAL_TABLET | Freq: Every day | ORAL | Status: DC
  Administered 2016-03-10: 500 mg via ORAL
  Filled 2016-03-09 (×4): qty 1

## 2016-03-09 MED ORDER — NICOTINE 21 MG/24HR TD PT24
21.0000 mg | MEDICATED_PATCH | Freq: Every day | TRANSDERMAL | Status: DC
  Administered 2016-03-10 – 2016-03-12 (×3): 21 mg via TRANSDERMAL
  Filled 2016-03-09 (×5): qty 1

## 2016-03-09 MED ORDER — MAGNESIUM HYDROXIDE 400 MG/5ML PO SUSP: 30.0000 mL | Freq: Every day | ORAL | Status: DC | PRN

## 2016-03-09 MED ORDER — DIVALPROEX SODIUM ER 500 MG PO TB24
1500.0000 mg | ORAL_TABLET | Freq: Every evening | ORAL | Status: DC
  Administered 2016-03-10 – 2016-03-11 (×2): 1500 mg via ORAL
  Filled 2016-03-09 (×3): qty 3

## 2016-03-09 MED ORDER — DIVALPROEX SODIUM ER 500 MG PO TB24
1500.0000 mg | ORAL_TABLET | Freq: Every evening | ORAL | Status: DC
  Administered 2016-03-09: 1500 mg via ORAL
  Filled 2016-03-09: qty 3
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
## 2016-03-09 NOTE — ED Provider Notes (Signed)
  Physical Exam  BP 136/90 mmHg  Pulse 77  Temp(Src) 98.5 F (36.9 C) (Oral)  Resp 16  Ht 6\' 5"  (1.956 m)  Wt 206 lb (93.441 kg)  BMI 24.42 kg/m2  SpO2 99%  Physical Exam  ED Course  Procedures  [facility] DR Earlean PolkaEappen      George Fayson, MD 03/09/16 2059
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
## 2016-03-09 NOTE — ED Notes (Signed)
Pt gave verbal consent with Dr. Jannifer FranklinAkintayo and Josaphine to talk about his treatment with his wife and with her present in his room.

## 2016-03-09 NOTE — Progress Notes (Signed)
Patient ID: George CoyerBranden Gulla, male   DOB: 1981/02/12, 35 y.o.   MRN: [id number] Per State regulations 482.30 this chart was reviewed for medical necessity with respect to the patient's admission/duration of stay.    Next review date: 03/12/16  George CoyerEric Jamarea Kelley, BSN, RN-BC  Case Manager
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
## 2016-03-10 ENCOUNTER — Encounter (HOSPITAL_COMMUNITY): Payer: Self-pay | Admitting: Psychiatry

## 2016-03-10 DIAGNOSIS — F250 Schizoaffective disorder, bipolar type: Principal | ICD-10-CM

## 2016-03-10 DIAGNOSIS — F319 Bipolar disorder, unspecified: Secondary | ICD-10-CM

## 2016-03-10 LAB — TSH: TSH: 1.914 u[IU]/mL (ref 0.350–4.500)

## 2016-03-10 LAB — VALPROIC ACID LEVEL: Valproic Acid Lvl: 68 ug/mL (ref 50.0–100.0)

## 2016-03-10 NOTE — BHH Group Notes (Signed)
Adult Psychoeducational Group Note  Date:  03/10/2016 Time:  8:37 PM  Group Topic/Focus:  Wrap-Up Group:   The focus of this group is to help patients review their daily goal of treatment and discuss progress on daily workbooks.  Participation Level:  Active  Participation Quality:  Appropriate  Affect:  Appropriate  Cognitive:  Appropriate  Insight: Good  Engagement in Group:  Engaged  Modes of Intervention:  Discussion  Additional Comments:  Pt rated his day a 10 out of 10 because he "went outside and played basketball".  His goal was to go home.  Pt expressed that he may go home tomorrow.  He also stated he was not feeling homicidal, suicidal or anything else.  Caroll RancherLindsay, Mindi Akerson A 03/10/2016, 8:37 PM
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
## 2016-03-10 NOTE — Progress Notes (Signed)
DAR NOTE: Pt has been calm and cooperative in the unit, has been visible in the milieu. Pt came to med window, took his am medication as scheduled. When asked if he was having thoughts of hurting himself, pt stated " I am not here to hurt anybody, I have  Never hurt anybody, I just want to go home." Pt denied any physical pain. As per self inventory, pt had a good night sleep, good appetite, normal energy, and good concentration. Pt rate depression at 0, hopeless ness at 0, and anxiety at a 0. Pt's safety ensured with 15 minute and environmental checks. Pt currently denies SI/HI and A/V hallucinations. Pt verbally agrees to seek staff if SI/HI or A/VH occurs and to consult with staff before acting on these thoughts. Will continue POC.
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
## 2016-03-10 NOTE — BHH Group Notes (Signed)
[facility]   03/10/2016 1:26 PM  Participation Quality:  Engaged  Mood/Affect:  Irritable  Depression Rating:    Anxiety Rating:    Thoughts of Suicide:  No Will you contract for safety?   NA  Current AVH:  No  Plan for Discharge/Comments:  "[facility]  Quincy Carneshey called 12 police to come take me to the emergency room.  Of course I got angry then.  I am not suicidal, I am not homocidal.  I am not hearing or seeing things.  I just want to go home."  Transportation Means:   Supports:  Daryel GeraldNorth, Karsten Howry B
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
## 2016-03-10 NOTE — B[facility] Group Notes (Signed)
[facility]  03/10/2016 , 1:28 PM    Type of Therapy:  [employer] Presentation  Participation Level:  Active  Participation Quality:  Attentive  Affect:  Blunted  Cognitive:  Oriented  Insight:  Limited  Engagement in Therapy:  Engaged  Modes of Intervention:  Discussion, Education and Socialization  Summary of Progress/Problems:  Onalee HuaDavid from [employer] came to present his recovery story and play the guitar.  Came initially.  Engaged.  Left for a significant period of time.  Returned, and was loud and distracting.  Not overly so, and not in a mean spirited way, but still was.  Presenter was able to continue.  Daryel Geraldorth, Rosalinda Seaman B 03/10/2016 , 1:28 PM

## 2016-03-10 NOTE — Tx Team (Signed)
Initial Interdisciplinary Treatment Plan   PATIENT STRESSORS: "I just wanna go home"    PATIENT STRENGTHS: General fund of knowledge Religious Affiliation Supportive family/friends   PROBLEM LIST: Problem List/Patient Goals Date to be addressed Date deferred Reason deferred Estimated date of resolution  "going home"  03/10/16           HI 03/10/16     psychosis 03/10/16                                    DISCHARGE CRITERIA:  Improved stabilization in mood, thinking, and/or behavior  PRELIMINARY DISCHARGE PLAN: Outpatient therapy Return to previous living arrangement  PATIENT/FAMIILY INVOLVEMENT: This treatment plan has been presented to and reviewed with the patient, Thurman CoyerBranden Riden.  The patient and family have been given the opportunity to ask questions and make suggestions.  Arrie AranChurch, Delberta Folts J 03/10/2016, 12:13 AM
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
## 2016-03-10 NOTE — Progress Notes (Signed)
D: Pt was in his room upon initial assessment.  Pt has labile affect and mood.  Pt reports his day was "good."  Pt reports he "met with the doctor today, I might go home tomorrow.  Everybody keeps telling me I'm doing good.  No fights, no arguments."  Pt states he feels safe to discharge tomorrow.  Stated he had a "good" visit with his wife tonight.  He is paranoid, initially refusing HS medications stating "I'm not about to take no more Depakote."  Pt became loud and irritable.  Writer showed pt the medications, which were still in the package.  Pt was informed that writer is not trying to administer Depakote, as it is not scheduled at this time.  Pt informed that the medications scheduled and the ones writer is trying to administer is Seroquel 800 mg and Trazodone 100 mg.  Pt continues to report that he was given "Depakote earlier, y'all not about to overdose me, I'm not taking it."  Pt denies SI/HI, denies hallucinations, denies pain.  Pt called his wife and his wife was informed of the medications scheduled for pt since he has authorized her to receive medical information.  She verbalized understanding and spoke with pt again.  Wife reports that pt prefers to interact with male staff.  Male RN offered pt his HS medications and pt was compliant with Seroquel 800 mg.  He continues to refuse Trazodone 100 mg.    A: Met with pt and offered support and encouragement.  Medications offered per order.   R: Pt is non-compliant with Trazodone 100 mg.  He took Seroquel 800 mg.  Pt verbally contracts for safety.  Will continue to monitor and assess.
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
## 2016-03-10 NOTE — Plan of Care (Signed)
Problem: Safety: Goal: Ability to remain free from injury will improve Outcome: Progressing Pt has not harmed self or others since admission.  Pt denies thoughts of harming self or others.

## 2016-03-11 MED ORDER — OLANZAPINE 5 MG PO TABS: 5.0000 mg | ORAL_TABLET | Freq: Three times a day (TID) | ORAL | Status: DC | PRN

## 2016-03-11 MED ORDER — TRAZODONE HCL 100 MG PO TABS: 100.0000 mg | ORAL_TABLET | Freq: Every evening | ORAL | Status: DC | PRN

## 2016-03-11 MED ORDER — QUETIAPINE FUMARATE 300 MG PO TABS
600.0000 mg | ORAL_TABLET | Freq: Every evening | ORAL | Status: DC
  Administered 2016-03-11: 600 mg via ORAL
  Filled 2016-03-11 (×2): qty 2

## 2016-03-11 MED ORDER — OLANZAPINE 10 MG IM SOLR: 5.0000 mg | Freq: Three times a day (TID) | INTRAMUSCULAR | Status: DC | PRN

## 2016-03-11 NOTE — Progress Notes (Signed)
George Kelley  MRN:  [id number] Subjective: Pt states " I want to be released today , I do not want a male doctor or nurse, I already told them, I am very tired today since he gave me a lot of medications yesterday."  Objective:George Kelley is a [age] y old AAM with hx of schizoaffective do, [facility]  Patient seen and chart reviewed today.Discussed patient with treatment team.  Pt today seen in bed, thought process continues to be disorganized, speech tangential - pt also vaguely irritable requiring redirection on the unit . Per staff - pt continues to have problems with male staff and specifically asks for male staff. Pt also with side effects of drowsiness and malaise on his combination of medications. Will need to readjust his dosage.       Principal Problem: Schizoaffective disorder, bipolar type (HCC) Diagnosis:   Patient Active Problem List   Diagnosis Date Noted  . Bipolar I disorder (HCC) [F31.9]   . Bipolar affective disorder, manic, severe, with psychotic behavior (HCC) [F31.2] 03/09/2016  . Schizoaffective disorder, bipolar type (HCC) [F25.0] 03/09/2016  . Homicidal ideation [R45.850]    Total Time spent with patient: 25 minutes  Past Psychiatric History: Please see H&P.   Past Medical History:  Past Medical History  Diagnosis Date  . Hypertension   . Asthma   . Bipolar affective (HCC)   . Schizophrenia [facility](HCC)     Past Surgical History  Procedure Laterality Date  . Left knee    . Head injury     Family History: Please see H&P. Family Psychiatric  History: Please see H&P.  Social History: Please see H&P.  History  Alcohol Use  . Yes     History  Drug Use No    Social History   Social History  . Marital Status: Single    Spouse Name: N/A  . Number of Children: N/A  . Years of Education: N/A   Social History Main Topics  . Smoking status: Current Every Day  Smoker -- 1.00 packs/day    Types: Cigarettes  . Smokeless tobacco: None  . Alcohol Use: Yes  . Drug Use: No  . Sexual Activity: Yes   Other Topics Concern  . None   Social History Narrative   Additional Social History:    Pain Medications: denies Prescriptions: denies Over the Counter: denies History of alcohol / drug use?: No history of alcohol / drug abuse (pt denies)                    Sleep: Fair  Appetite:  Fair  Current Medications: Current Facility-Administered Medications  Medication Dose Route Frequency Provider Last Rate Last Dose  . acetaminophen (TYLENOL) tablet 650 mg  650 mg Oral Q6H PRN Earney NavyJosephine C Onuoha, NP      . alum & mag hydroxide-simeth (MAALOX/MYLANTA) 200-200-20 MG/5ML suspension 30 mL  30 mL Oral Q4H PRN Earney NavyJosephine C Onuoha, NP      . divalproex (DEPAKOTE ER) 24 hr tablet 1,500 mg  1,500 mg Oral QPM Earney NavyJosephine C Onuoha, NP   1,500 mg at 03/10/16 1720  . magnesium hydroxide (MILK OF MAGNESIA) suspension 30 mL  30 mL Oral Daily PRN Earney NavyJosephine C Onuoha, NP      . nicotine (NICODERM CQ - dosed in mg/24 hours) patch 21 mg  21 mg Transdermal Daily Earney NavyJosephine C Onuoha, NP   21 mg at 03/11/16 0800  .  OLANZapine (ZYPREXA) tablet 5 mg  5 mg Oral TID PRN Jomarie Longs, MD       Or  . OLANZapine (ZYPREXA) injection 5 mg  5 mg Intramuscular TID PRN Jomarie Longs, MD      . QUEtiapine (SEROQUEL) tablet 600 mg  600 mg Oral QPM Syna Gad, MD      . traZODone (DESYREL) tablet 100 mg  100 mg Oral QHS PRN Jomarie Longs, MD        Lab Results:  Results for orders placed or performed during the hospital encounter of 03/09/16 (from the past 48 hour(s))  Valproic acid level     Status: None   Collection Time: 03/10/16  6:17 AM  Result Value Ref Range   Valproic Acid Lvl 68 50.0 - 100.0 ug/mL    Comment: Performed at [facility]  TSH     Status: None   Collection Time: 03/10/16  6:17 PM  Result Value Ref Range   TSH 1.914 0.350 - 4.500  uIU/mL    Comment: Performed at [facility]    Blood Alcohol level:  Lab Results  Component Value Date   [facility] <5 03/08/2016    Physical Findings: AIMS: Facial and Oral Movements Muscles of Facial Expression: None, normal Lips and Perioral Area: None, normal Jaw: None, normal Tongue: None, normal,Extremity Movements Upper (arms, wrists, hands, fingers): None, normal Lower (legs, knees, ankles, toes): None, normal, Trunk Movements Neck, shoulders, hips: None, normal, Overall Severity Severity of abnormal movements (highest score from questions above): None, normal Incapacitation due to abnormal movements: None, normal Patient's awareness of abnormal movements (rate only patient's report): No Awareness, Dental Status Current problems with teeth and/or dentures?: No Does patient usually wear dentures?: No  CIWA:  CIWA-Ar Total: 0 COWS:  COWS Total Score: 2  Musculoskeletal: Strength & Muscle Tone: within normal limits Gait & Station: normal Patient leans: N/A  Psychiatric Specialty Exam: Review of Systems  Constitutional: Positive for malaise/fatigue.  Musculoskeletal: Positive for myalgias.  Psychiatric/Behavioral: Positive for depression. The patient is nervous/anxious.   All other systems reviewed and are negative.   Blood pressure 141/93, pulse 86, temperature 98.2 F (36.8 C), temperature source Oral, resp. rate 18, height 6\' 5"  (1.956 m), weight 93.441 kg (206 lb), SpO2 98 %.Body mass index is 24.42 kg/(m^2).  General Appearance: Disheveled  Eye Solicitor::  Fair  Speech:  Pressured  Volume:  Increased  Mood:  Anxious and Irritable  Affect:  Congruent  Thought Process:  Irrelevant and Tangential  Orientation:  Full (Time, Place, and Person)  Thought Content:  Delusions, Paranoid Ideation and Rumination preoccupied with not wanting a male staff to take care of him   Suicidal Thoughts:  No  Homicidal Thoughts:  No  Memory:  Immediate;    Fair Recent;   Fair Remote;   Fair  Judgement:  Poor  Insight:  Shallow  Psychomotor Activity:  Restlessness  Concentration:  Poor  Recall:  [employer] of Knowledge:Fair  Language: Fair  Akathisia:  No  Handed:  Right  AIMS (if indicated):     Assets:  Desire for Improvement  ADL's:  Intact  Cognition: WNL  Sleep:  Number of Hours: 6.25   Treatment Plan Summary:George Kelley is a 89 y old AAM with hx of schizoaffective do, who was IVCed for agitation/aggressive behavior at [employer]. Pt today continues to be irritable , pressured , however drowsy , has been complaining of being tired. Will readjust medications .  Daily contact with patient to assess and evaluate symptoms and progress in treatment and Medication management  Reviewed past medical records,treatment plan. Will reduce Seroquel to 600 mg po qhs for drowsiness in the AM- for psychosis. Will reduce Depakote ER to 1500 mg po qhs for mood lability - recent Depakote level - therapeutic .  Will change Trazodone to 100 mg po qhs prn for sleep. Will make available PRN medications as per agitation protocol. Will continue to monitor vitals ,medication compliance and treatment side effects while patient is here.  Will monitor for medical issues as well as call consult as needed.  Will order, Hba1c, lipid panel, pl - tsh -WNL .Will also get EKG for qtc. CSW will continue working on disposition.  Patient to participate in therapeutic milieu .         George Whitworth, MD 03/11/2016, 1:59 PM

## 2016-03-11 NOTE — BHH Group Notes (Signed)
BHH Group Notes:  (Nursing/MHT/Case Management/Adjunct)  Date:  03/11/2016  Time:  10:31 AM  Type of Therapy:  Nurse Education  Participation Level:  Did Not Attend    Mickie Baillizabeth O Iwenekha 03/11/2016, 10:31 AM

## 2016-03-11 NOTE — Progress Notes (Signed)
D:Patient in the hallway on approach.  Patient states he had good day.  Patient states he had difficulty waking up this morning.  Patient states he spoke with his doctor and his medications were adjusted.  Patient states he is happy with this.  Patient states he hopes he can be discharged soon.  Patient denies SI/HI and denies AVH. A: Staff to monitor Q 15 mins for safety.  Encouragement and support offered.  Scheduled medications administered per orders.   R: Patient remains safe on the unit.  Patient attended group tonight.  Patient visible on the unit and interacting with peers.  Patient taking administered medications.

## 2016-03-11 NOTE — Progress Notes (Signed)
DAR NOTE: Patient presents with anxious mood and affect.  Denies pain, auditory and visual hallucinations.  Rates depression at 0, hopelessness at 0, and anxiety at 0.  Maintained on routine safety checks.  Medications given as prescribed.  Support and encouragement offered as needed.  States goal for today is "discharge."  Patient preoccupied with discharge.  Patient complain of drowsiness and reports that he was given too much medications last night.  Patient out of bed just before lunch time.  Patient up and in the dayroom socializing with peers.

## 2016-03-12 LAB — LIPID PANEL
CHOL/HDL RATIO: 4.8 ratio
Cholesterol: 148 mg/dL (ref 0–200)
HDL: 31 mg/dL — AB (ref >40)
LDL CALC: 76 mg/dL (ref 0–99)
Triglycerides: 206 mg/dL — ABNORMAL HIGH (ref <150)
VLDL: 41 mg/dL — ABNORMAL HIGH (ref 0–40)

## 2016-03-12 MED ORDER — QUETIAPINE FUMARATE 300 MG PO TABS
600.0000 mg | ORAL_TABLET | Freq: Every evening | ORAL | Status: DC
Start: 2016-03-12 — End: 2021-02-11

## 2016-03-12 MED ORDER — DIVALPROEX SODIUM ER 500 MG PO TB24: 1500.0000 mg | ORAL_TABLET | Freq: Every evening | ORAL | Status: DC

## 2016-03-12 MED ORDER — NICOTINE 21 MG/24HR TD PT24: 21.0000 mg | MEDICATED_PATCH | Freq: Every day | TRANSDERMAL | Status: DC

## 2016-03-12 NOTE — Progress Notes (Signed)
  [facility] Discharge Plan :  Will you be returning to the same living situation after discharge:  Yes,  home At discharge, do you have transportation home?: Yes,  family Do you have the ability to pay for your medications: Yes,  MCD  Release of information consent forms completed and in the chart;  Patient's signature needed at discharge.  Patient to Follow up at: Follow-up Information    Follow up with [facility] On 04/02/2016.   Why:  Friday at 12:00 with Crystal M.   Contact information:   [address] [place], [place] [address] Phone: [phone] Fax: [phone]      Next level of care provider has access to [facility] Link:no  Safety Planning and Suicide Prevention discussed: Yes,  yes  Have you used any form of tobacco in the last 30 days? (Cigarettes, Smokeless Tobacco, Cigars, and/or Pipes): Yes  Has patient been referred to the Quitline?: Yes, faxed on 03/12/16  Patient has been referred for addiction treatment: Yes  Ida Rogueorth, Kember Boch B 03/12/2016, 10:51 AM

## 2016-03-12 NOTE — Discharge Summary (Signed)
Physician Discharge Summary Note  Patient:  George CoyerBranden Kelley is an 35 y.o., male MRN:  [id number] DOB:  [birth year] Patient phone:  [phone] (home)  Patient address:   [address] [place] [address],  Total Time spent with patient: 30 minutes  Date of Admission:  03/09/2016 Date of Discharge: 03/12/2016  Reason for Admission:  Agitation and anger  Principal Problem: Schizoaffective disorder, bipolar type [facility](HCC) Discharge Diagnoses: Patient Active Problem List   Diagnosis Date Noted  . Schizoaffective disorder, bipolar type (HCC) [F25.0] 03/09/2016    Past Psychiatric History:  See above noted  Past Medical History:  Past Medical History  Diagnosis Date  . Hypertension   . Asthma   . Bipolar affective (HCC)   . Schizophrenia [facility](HCC)     Past Surgical History  Procedure Laterality Date  . Left knee    . Head injury     Family History: History reviewed. No pertinent family history. Family Psychiatric  History:  Denied Social History:  History  Alcohol Use  . Yes     History  Drug Use No    Social History   Social History  . Marital Status: Single    Spouse Name: N/A  . Number of Children: N/A  . Years of Education: N/A   Social History Main Topics  . Smoking status: Current Every Day Smoker -- 1.00 packs/day    Types: Cigarettes  . Smokeless tobacco: None  . Alcohol Use: Yes  . Drug Use: No  . Sexual Activity: Yes   Other Topics Concern  . None   Social History Narrative    Hospital Course:  George Kelley, a 35 year old African-American male. [facility] with complaints of agitation/anger towards his mental health staff [facility]. Chart review also indicated a homicidal threats by George Kelley towards his neighbor.     George CoyerBranden Danis was admitted for Schizoaffective disorder, bipolar type (HCC) and crisis management.  He was treated with Seroquel to 600 mg for drowsiness in the AM- for psychosis, Depakote ER 1500  mg for mood lability, Trazodone 100 mg as needed for sleep and PRN medications as per agitation protocol.  Medical problems were identified and treated as needed.  Home medications were restarted as appropriate.  Improvement was monitored by observation and George CoyerBranden Warr daily report of symptom reduction.  Emotional and mental status was monitored by daily self inventory reports completed by George CoyerBranden Mazzola and clinical staff.  Patient reported continued improvement, denied any new concerns.  Patient had been compliant on medications and denied side effects.  Support and encouragement was provided.    Patient did well during inpatient stay.  At time of discharge, patient rated both depression and anxiety levels to be manageable and minimal.  Patient was able to identify the triggers of emotional crises and de-stabilizations.  Patient identified the positive things in life that would help in dealing with feelings of loss, depression and unhealthy or abusive tendencies.         George CoyerBranden Bloxham was evaluated by the treatment team for stability and plans for continued recovery upon discharge.  He was offered further treatment options upon discharge including Residential, Intensive Outpatient and Outpatient treatment.  He will follow up with agencies listed below for medication management and counseling.  Encouraged patient to maintain satisfactory support network and home environment.  Advised to adhere to medication compliance and outpatient treatment follow up.      George CoyerBranden Coss motivation was an  integral factor for scheduling further treatment.  Employment, transportation, bed availability, health status, family support, and any pending legal issues were also considered during his hospital stay.  Upon completion of this admission the patient was both mentally and medically stable for discharge denying suicidal/homicidal ideation, auditory/visual/tactile hallucinations, delusional thoughts and paranoia.       Physical Findings: AIMS: Facial and Oral Movements Muscles of Facial Expression: None, normal Lips and Perioral Area: None, normal Jaw: None, normal Tongue: None, normal,Extremity Movements Upper (arms, wrists, hands, fingers): None, normal Lower (legs, knees, ankles, toes): None, normal, Trunk Movements Neck, shoulders, hips: None, normal, Overall Severity Severity of abnormal movements (highest score from questions above): None, normal Incapacitation due to abnormal movements: None, normal Patient's awareness of abnormal movements (rate only patient's report): No Awareness, Dental Status Current problems with teeth and/or dentures?: No Does patient usually wear dentures?: No  CIWA:  CIWA-Ar Total: 0 COWS:  COWS Total Score: 1  Musculoskeletal: Strength & Muscle Tone: within normal limits Gait & Station: normal Patient leans: N/A  Psychiatric Specialty Exam:  SEE MD SRA Review of Systems  Psychiatric/Behavioral: Negative for suicidal ideas. Depression: improving. The patient is not nervous/anxious.   All other systems reviewed and are negative.   Blood pressure 131/88, pulse 83, temperature 97.5 F (36.4 C), temperature source Oral, resp. rate 18, height  (1.956 m), weight 93.441 kg (206 lb), SpO2 98 %.Body mass index is 24.42 kg/(m^2).  Have you used any form of tobacco in the last 30 days? (Cigarettes, Smokeless Tobacco, Cigars, and/or Pipes): Yes  Has this patient used any form of tobacco in the last 30 days? (Cigarettes, Smokeless Tobacco, Cigars, and/or Pipes) Yes, N/A  Blood Alcohol level:  Lab Results  Component Value Date   ETH <5 03/08/2016    Metabolic Disorder Labs:  No results found for: HGBA1C, MPG No results found for: PROLACTIN Lab Results  Component Value Date   CHOL 148 03/12/2016   TRIG 206* 03/12/2016   HDL 31* 03/12/2016   CHOLHDL 4.8 03/12/2016   VLDL 41* 03/12/2016   LDLCALC 76 03/12/2016    See Psychiatric Specialty Exam and Suicide  Risk Assessment completed by Attending Physician prior to discharge.  Discharge destination:  Home  Is patient on multiple antipsychotic therapies at discharge:  No   Has Patient had three or more failed trials of antipsychotic monotherapy by history:  No  Recommended Plan for Multiple Antipsychotic Therapies: NA     Medication List    STOP taking these medications        albuterol 108 (90 Base) MCG/ACT inhaler  Commonly known as:  PROVENTIL HFA;VENTOLIN HFA     traZODone 50 MG tablet  Commonly known as:  DESYREL      TAKE these medications      Indication   divalproex 500 MG 24 hr tablet  Commonly known as:  DEPAKOTE ER  Take 3 tablets (1,500 mg total) by mouth every evening.   Indication:  Manic Phase of Manic-Depression     nicotine 21 mg/24hr patch  Commonly known as:  NICODERM CQ - dosed in mg/24 hours  Place 1 patch (21 mg total) onto the skin daily.   Indication:  Nicotine Addiction     QUEtiapine 300 MG tablet  Commonly known as:  SEROQUEL  Take 2 tablets (600 mg total) by mouth every evening.        Follow-up Information    Follow up with [facility] On 04/02/2016.  Why:  Friday at 12:00 with Crystal M.   Contact information:   [address] [place] [address] Phone: [phone] Fax: [phone]      Follow-up recommendations:  Activity:  as tol Diet:  as tol  Comments:  1.  Take all your medications as prescribed.   2.  Report any adverse side effects to outpatient provider. 3.  Patient instructed to not use alcohol or illegal drugs while on prescription medicines. 4.  In the event of worsening symptoms, instructed patient to call 911, the crisis hotline or go to nearest emergency room for evaluation of symptoms.  Signed: Lindwood Qua, NP [facility] 03/12/2016, 2:12 PM
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
## 2016-03-12 NOTE — Progress Notes (Signed)
D:  Patient's self inventory sheet, patient sleeps good, no sleep medication given.  Good appetite, good concentration.  Denied depression, hopeless and anxiety.  Denied withdrawals.  Denied SI.  Denied physical problems.  Denied pain.  Goal is discharge home to wife.  Plans to "clearing the house up".  Wants to discharge today.  No discharge plans. A:  Medications administered per MD orders.  Emotional support and encouragement given patient. R:  Denied SI and HI, contracts for safety.  Denied A/V hallucinations.  Safety maintained with 15 minute checks.

## 2016-03-12 NOTE — [facility] Suicide Risk Assessment (Signed)
[facility]   Principal Problem: Schizoaffective disorder, bipolar type [facility](HCC) Discharge Diagnoses:  Patient Active Problem List   Diagnosis Date Noted  . Schizoaffective disorder, bipolar type (HCC) [F25.0] 03/09/2016    Total Time spent with patient: 20 minutes  Musculoskeletal: Strength & Muscle Tone: within normal limits Gait & Station: normal Patient leans: N/A  Psychiatric Specialty Exam: Review of Systems  Psychiatric/Behavioral: Negative for depression, suicidal ideas and substance abuse.  All other systems reviewed and are negative.   Blood pressure 115/99, pulse 103, temperature 97.5 F (36.4 C), temperature source Oral, resp. rate 18, height 6\' 5"  (1.956 m), weight 93.441 kg (206 lb), SpO2 98 %.Body mass index is 24.42 kg/(m^2).  General Appearance: Casual  Eye Contact::  Fair  Speech:  Clear and Coherent409  Volume:  Normal  Mood:  Euthymic  Affect:  Congruent  Thought Process:  Goal Directed  Orientation:  Full (Time, Place, and Person)  Thought Content:  WDL  Suicidal Thoughts:  No  Homicidal Thoughts:  No  Memory:  Immediate;   Fair Recent;   Fair Remote;   Fair  Judgement:  Fair  Insight:  Fair  Psychomotor Activity:  Normal  Concentration:  Fair  Recall:  [employer]Fair  Fund of Knowledge:Fair  Language: Fair  Akathisia:  No  Handed:  Right  AIMS (if indicated):     Assets:  Communication Skills Desire for Improvement  Sleep:  Number of Hours: 6.5  Cognition: WNL  ADL's:  Intact   Mental Status Per Nursing Assessment::   On Admission:  NA  Demographic Factors:  Male  Loss Factors: Financial problems/change in socioeconomic status  Historical Factors: NA  Risk Reduction Factors:   Positive social support  Continued Clinical Symptoms:  Previous Psychiatric Diagnoses and Treatments  Cognitive Features That Contribute To Risk:  None    Suicide Risk:  Minimal: No identifiable suicidal ideation.  Patients presenting  with no risk factors but with morbid ruminations; may be classified as minimal risk based on the severity of the depressive symptoms  Follow-up Information    Follow up with [facility] On 04/02/2016.   Why:  Friday at 12:00 with Crystal M.   Contact information:   [address] [place], [place] [address] Phone: [phone] Fax: [phone]      Plan Of Care/Follow-up recommendations:  Activity:  no restrictions Diet:  regular Tests:  as needed Other:  follow up with aftercare  Marly Schuld, MD 03/12/2016, 9:35 AM

## 2016-03-12 NOTE — Progress Notes (Signed)
Discharge Note:  Patient discharged home with wife.  Patient denied SI and Hi.  Denied A/V hallucinations.  Suicide prevention information given and discussed with patient who stated he understood and had no questions.  Patient stated he received all his belongings, clothing, toiletries, misc items, prescriptions, etc.  Patient stated he appreciated all assistance received from [facility] staff.  All required discharge information given to patient at discharge.

## 2016-03-13 LAB — PROLACTIN: PROLACTIN: 24.1 ng/mL — AB (ref 4.0–15.2)

## 2016-03-13 LAB — HEMOGLOBIN A1C
Hgb A1c MFr Bld: 5.8 % — ABNORMAL HIGH (ref 4.8–5.6)
MEAN PLASMA GLUCOSE: 120 mg/dL

## 2016-10-14 ENCOUNTER — Emergency Department (HOSPITAL_COMMUNITY)
Admission: EM | Admit: 2016-10-14 | Discharge: 2016-10-14 | Disposition: A | Discharge status: Home / Self Care | Payer: Medicare Other | Attending: Emergency Medicine | Admitting: Emergency Medicine

## 2016-10-14 ENCOUNTER — Emergency Department (HOSPITAL_COMMUNITY): Payer: Medicare Other

## 2016-10-14 ENCOUNTER — Encounter (HOSPITAL_COMMUNITY): Payer: Self-pay | Admitting: Emergency Medicine

## 2016-10-14 DIAGNOSIS — Z79899 Other long term (current) drug therapy: Secondary | ICD-10-CM | POA: Diagnosis not present

## 2016-10-14 DIAGNOSIS — F17210 Nicotine dependence, cigarettes, uncomplicated: Secondary | ICD-10-CM | POA: Diagnosis not present

## 2016-10-14 DIAGNOSIS — I10 Essential (primary) hypertension: Secondary | ICD-10-CM | POA: Diagnosis not present

## 2016-10-14 DIAGNOSIS — N451 Epididymitis: Secondary | ICD-10-CM | POA: Diagnosis not present

## 2016-10-14 DIAGNOSIS — N50811 Right testicular pain: Secondary | ICD-10-CM | POA: Diagnosis present

## 2016-10-14 DIAGNOSIS — J45909 Unspecified asthma, uncomplicated: Secondary | ICD-10-CM | POA: Insufficient documentation

## 2016-10-14 LAB — URINALYSIS, ROUTINE W REFLEX MICROSCOPIC
BACTERIA UA: NONE SEEN
BILIRUBIN URINE: NEGATIVE
GLUCOSE, UA: NEGATIVE mg/dL
Hgb urine dipstick: NEGATIVE
KETONES UR: NEGATIVE mg/dL
NITRITE: NEGATIVE
PH: 5 (ref 5.0–8.0)
Protein, ur: 30 mg/dL — AB
SPECIFIC GRAVITY, URINE: 1.026 (ref 1.005–1.030)

## 2016-10-14 MED ORDER — IBUPROFEN 400 MG PO TABS: 400.0000 mg | ORAL_TABLET | Freq: Four times a day (QID) | ORAL | 0 refills | Status: DC | PRN

## 2016-10-14 MED ORDER — DOXYCYCLINE HYCLATE 100 MG PO TABS
100.0000 mg | ORAL_TABLET | Freq: Once | ORAL | Status: AC
  Administered 2016-10-14: 100 mg via ORAL
  Filled 2016-10-14: qty 1

## 2016-10-14 MED ORDER — ALBUTEROL SULFATE HFA 108 (90 BASE) MCG/ACT IN AERS: 1.0000 | INHALATION_SPRAY | Freq: Four times a day (QID) | RESPIRATORY_TRACT | 0 refills | Status: AC | PRN

## 2016-10-14 MED ORDER — DOXYCYCLINE HYCLATE 100 MG PO CAPS: 100.0000 mg | ORAL_CAPSULE | Freq: Two times a day (BID) | ORAL | 0 refills | Status: AC

## 2016-10-14 MED ORDER — FENTANYL CITRATE (PF) 100 MCG/2ML IJ SOLN: 50.0000 ug | Freq: Once | INTRAMUSCULAR | Status: DC

## 2016-10-14 MED ORDER — LIDOCAINE HCL 1 % IJ SOLN
INTRAMUSCULAR | Status: AC
  Administered 2016-10-14: 0.9 mL
  Filled 2016-10-14: qty 20

## 2016-10-14 MED ORDER — HYDROMORPHONE HCL 2 MG/ML IJ SOLN
1.0000 mg | Freq: Once | INTRAMUSCULAR | Status: AC
  Administered 2016-10-14: 1 mg via INTRAVENOUS
  Filled 2016-10-14: qty 1

## 2016-10-14 MED ORDER — CEFTRIAXONE SODIUM 250 MG IJ SOLR
250.0000 mg | Freq: Once | INTRAMUSCULAR | Status: AC
  Administered 2016-10-14: 250 mg via INTRAMUSCULAR
  Filled 2016-10-14: qty 250

## 2016-10-14 NOTE — Discharge Instructions (Signed)
Please take 1 capsule by mouth 2 times daily of doxycycline. Take ibuprofen as needed for pain.  Contact a health care provider if: You have a fever. Your pain medicine is not helping. Your pain is getting worse. Your symptoms do not improve within three days.

## 2016-10-14 NOTE — ED Provider Notes (Signed)
[facility] Provider Note   CSN: [id number] Arrival date & time: 10/14/16  1606     History   Chief Complaint Chief Complaint  Patient presents with  . Abdominal Pain    HPI George Kelley is a 35 y.o. male with history of bipolar, hypertension and schizophrenia presenting with right suprapubic and right testicular pain for the last 3 days. Patient states that his pain has been worsening last 3 days and get characterizes his pain as pressure, and 10/10. He reports pushing onto his suprapubic area triggers pain. Patient reports taking a muscle relaxer yesterday which temporarily relieved his pain. He reports never having anything like this before. He states that he is able to walk and urinate without problems. Patient denies any fevers, chills, nausea, diarrhea, vomiting, left testicular pain.   HPI  Past Medical History:  Diagnosis Date  . Asthma   . Bipolar affective (HCC)   . Hypertension   . Schizophrenia [facility](HCC)     Patient Active Problem List   Diagnosis Date Noted  . Schizoaffective disorder, bipolar type (HCC) 03/09/2016    Past Surgical History:  Procedure Laterality Date  . head injury    . Left knee         Home Medications    Prior to Admission medications   Medication Sig Start Date End Date Taking? Authorizing Provider  divalproex (DEPAKOTE ER) 500 MG 24 hr tablet Take 3 tablets (1,500 mg total) by mouth every evening. 03/12/16  Yes Adonis BrookSheila Agustin, NP  QUEtiapine (SEROQUEL) 300 MG tablet Take 2 tablets (600 mg total) by mouth every evening. 03/12/16  Yes Adonis BrookSheila Agustin, NP  albuterol (PROVENTIL HFA;VENTOLIN HFA) 108 (90 Base) MCG/ACT inhaler Inhale 1-2 puffs into the lungs every 6 (six) hours as needed for wheezing or shortness of breath. 10/14/16   Francisco Manuel [place], [place]  doxycycline (VIBRAMYCIN) 100 MG capsule Take 1 capsule (100 mg total) by mouth 2 (two) times daily. 10/14/16 10/24/16  Francisco Manuel [place], [place]  ibuprofen (ADVIL,MOTRIN)  400 MG tablet Take 1 tablet (400 mg total) by mouth every 6 (six) hours as needed. 10/14/16   Francisco Manuel [place], [place]  nicotine (NICODERM CQ - DOSED IN MG/24 HOURS) 21 mg/24hr patch Place 1 patch (21 mg total) onto the skin daily. Patient not taking: Reported on 10/14/2016 03/12/16   Adonis BrookSheila Agustin, NP    Family History No family history on file.  Social History Social History  Substance Use Topics  . Smoking status: Current Every Day Smoker    Packs/day: 1.00    Types: Cigarettes  . Smokeless tobacco: Never Used  . Alcohol use Yes     Allergies   Pork-derived products and Tomato   Review of Systems Review of Systems  Constitutional: Negative for chills and fever.  Eyes: Negative for visual disturbance.  Respiratory: Negative for chest tightness and shortness of breath.   Cardiovascular: Negative for chest pain.  Gastrointestinal: Negative for abdominal pain, constipation, diarrhea, nausea and vomiting.  Endocrine: Negative for polyuria.  Genitourinary: Positive for testicular pain (Right). Negative for difficulty urinating, dysuria, genital sores, penile pain, penile swelling and scrotal swelling.  Musculoskeletal: Negative for back pain.  Skin: Negative for wound.  Neurological: Negative for headaches.     Physical Exam Updated Vital Signs BP 141/83 (BP Location: Right Arm)   Pulse 82   Temp 98.1 F (36.7 C) (Oral)   Resp 16   Wt 89.8 kg   SpO2 95%   BMI 23.48 kg/m  Physical Exam  Constitutional: He is oriented to person, place, and time. He appears well-developed and well-nourished.  HENT:  Head: Normocephalic and atraumatic.  Nose: Nose normal.  Mouth/Throat: Oropharynx is clear and moist.  Eyes: Conjunctivae and EOM are normal. Pupils are equal, round, and reactive to light.  Neck: Normal range of motion. Neck supple.  Cardiovascular: Normal rate, normal heart sounds and intact distal pulses.   Pulmonary/Chest: Effort normal and breath sounds  normal. No respiratory distress. He exhibits no tenderness.  Abdominal: Soft. Bowel sounds are normal. There is no tenderness. There is no rebound and no guarding.  Genitourinary: Penis normal. Right testis shows tenderness. Right testis shows no swelling. Left testis shows no swelling and no tenderness.  Genitourinary Comments: RN as Chaperone present  Musculoskeletal: Normal range of motion.  Neurological: He is alert and oriented to person, place, and time.  Skin: Skin is warm. Capillary refill takes less than 2 seconds.  Psychiatric: He has a normal mood and affect. His behavior is normal.  Nursing note and vitals reviewed.    ED Treatments / Results  Labs (all labs ordered are listed, but only abnormal results are displayed) Labs Reviewed  URINALYSIS, ROUTINE W REFLEX MICROSCOPIC - Abnormal; Notable for the following:       Result Value   Color, Urine AMBER (*)    APPearance CLOUDY (*)    Protein, ur 30 (*)    Leukocytes, UA LARGE (*)    Squamous Epithelial / LPF 0-5 (*)    Non Squamous Epithelial 0-5 (*)    All other components within normal limits  GC/CHLAMYDIA PROBE AMP ([place]) NOT AT [facility]    EKG  EKG Interpretation None       Radiology US Scrotum  Result Date: 10/14/2016 CLINICAL DATA:  Right testicular pain x3 days EXAM: SCROTAL ULTRASOUND DOPPLER ULTRASOUND OF THE TESTICLES TECHNIQUE: Complete ultrasound examination of the testicles, epididymis, and other scrotal structures was performed. Color and spectral Doppler ultrasound were also utilized to evaluate blood flow to the testicles. COMPARISON:  None. FINDINGS: Right testicle Measurements: 3.3 x 2.4 x 2.7 cm. No mass or microlithiasis visualized. Left testicle Measurements: 4.0 x 2.3 x 2.3 cm. No mass or microlithiasis visualized. Right epididymis: Epididymal tail is enlarged, heterogeneous, and hypervascular. Left epididymis:  Normal in size and appearance. Hydrocele:  Right hydrocele. Varicocele:  None  visualized. Pulsed Doppler interrogation of both testes demonstrates normal low resistance arterial and venous waveforms bilaterally. IMPRESSION: Normal sonographic appearance of the bilateral testes. Findings compatible with right epididymitis. Associated right hydrocele. No evidence of testicular torsion. Electronically Signed   By: Charline Bills M.D.   On: 10/14/2016 17:46   [place] Art/ven Flow Abd Pelv Doppler  Result Date: 10/14/2016 CLINICAL DATA:  Right testicular pain x3 days EXAM: SCROTAL ULTRASOUND DOPPLER ULTRASOUND OF THE TESTICLES TECHNIQUE: Complete ultrasound examination of the testicles, epididymis, and other scrotal structures was performed. Color and spectral Doppler ultrasound were also utilized to evaluate blood flow to the testicles. COMPARISON:  None. FINDINGS: Right testicle Measurements: 3.3 x 2.4 x 2.7 cm. No mass or microlithiasis visualized. Left testicle Measurements: 4.0 x 2.3 x 2.3 cm. No mass or microlithiasis visualized. Right epididymis: Epididymal tail is enlarged, heterogeneous, and hypervascular. Left epididymis:  Normal in size and appearance. Hydrocele:  Right hydrocele. Varicocele:  None visualized. Pulsed Doppler interrogation of both testes demonstrates normal low resistance arterial and venous waveforms bilaterally. IMPRESSION: Normal sonographic appearance of the bilateral testes. Findings compatible with right epididymitis.  Associated right hydrocele. No evidence of testicular torsion. Electronically Signed   By: Charline BillsSriyesh  Krishnan M.D.   On: 10/14/2016 17:46    Procedures Procedures (including critical care time)  Medications Ordered in ED Medications  HYDROmorphone (DILAUDID) injection 1 mg (1 mg Intravenous Given 10/14/16 1711)  cefTRIAXone (ROCEPHIN) injection 250 mg (250 mg Intramuscular Given 10/14/16 1843)  doxycycline (VIBRA-TABS) tablet 100 mg (100 mg Oral Given 10/14/16 1840)  lidocaine (XYLOCAINE) 1 % (with pres) injection (0.9 mLs  Given 10/14/16  1843)     Initial Impression / Assessment and Plan / ED Course  I have reviewed the triage vital signs and the nursing notes.  Pertinent labs & imaging results that were available during my care of the patient were reviewed by me and considered in my medical decision making (see chart for details).  Clinical Course   Patient is a 35 year old male presenting with suprapubic pain and right testicular pain for 3 days. On exam patient is afebrile and hemodynamically stable. Heart and lung sounds are clear. Abdomen is soft/benign. TTP to right suprapubic area. On testicular exam right testicle significantly tender to palpation. No scrotal swelling or discoloration noted. Left testicle is unremarkable. Ultrasound shows evidence of right epididymitis. Patient denied gonorrhea/chlamydia probe. Patient will be treated accordingly with first dose of antibiotics given here in ED. Patient's pain treated in ED. Patient is afebrile and hemodynamically stable at this time. Patient agree with assessment and plan. [facility].  Final Clinical Impressions(s) / ED Diagnoses   Final diagnoses:  Epididymitis    New Prescriptions Discharge Medication List as of 10/14/2016  6:35 PM    START taking these medications   Details  albuterol (PROVENTIL HFA;VENTOLIN HFA) 108 (90 Base) MCG/ACT inhaler Inhale 1-2 puffs into the lungs every 6 (six) hours as needed for wheezing or shortness of breath., Starting Thu 10/14/2016, Print    doxycycline (VIBRAMYCIN) 100 MG capsule Take 1 capsule (100 mg total) by mouth 2 (two) times daily., Starting Thu 10/14/2016, Until Sun 10/24/2016, Print    ibuprofen (ADVIL,MOTRIN) 400 MG tablet Take 1 tablet (400 mg total) by mouth every 6 (six) hours as needed., Starting Thu 10/14/2016, Print         [address] [place], [place] 10/14/16 2229    Pricilla LovelessScott Goldston, MD 10/16/16 2320

## 2016-10-14 NOTE — ED Notes (Signed)
Patient aware that urine sample is needed. Urinal is at the bedside now

## 2016-10-14 NOTE — ED Triage Notes (Signed)
Patient here with complaints of right lower abd pain x3 day. Denies n/v/d.

## 2016-10-15 LAB — GC/CHLAMYDIA PROBE AMP (~~LOC~~) NOT AT ARMC
CHLAMYDIA, DNA PROBE: POSITIVE — AB
Neisseria Gonorrhea: NEGATIVE

## 2016-10-16 ENCOUNTER — Encounter (HOSPITAL_COMMUNITY): Payer: Self-pay | Admitting: Emergency Medicine

## 2017-06-03 ENCOUNTER — Emergency Department (HOSPITAL_COMMUNITY)
Admission: EM | Admit: 2017-06-03 | Discharge: 2017-06-03 | Disposition: A | Discharge status: Home / Self Care | Payer: No Typology Code available for payment source | Attending: Emergency Medicine | Admitting: Emergency Medicine

## 2017-06-03 ENCOUNTER — Encounter (HOSPITAL_COMMUNITY): Payer: Self-pay | Admitting: Emergency Medicine

## 2017-06-03 DIAGNOSIS — Y999 Unspecified external cause status: Secondary | ICD-10-CM | POA: Insufficient documentation

## 2017-06-03 DIAGNOSIS — J45909 Unspecified asthma, uncomplicated: Secondary | ICD-10-CM | POA: Insufficient documentation

## 2017-06-03 DIAGNOSIS — I10 Essential (primary) hypertension: Secondary | ICD-10-CM | POA: Insufficient documentation

## 2017-06-03 DIAGNOSIS — S161XXA Strain of muscle, fascia and tendon at neck level, initial encounter: Secondary | ICD-10-CM | POA: Diagnosis not present

## 2017-06-03 DIAGNOSIS — Y93I9 Activity, other involving external motion: Secondary | ICD-10-CM | POA: Insufficient documentation

## 2017-06-03 DIAGNOSIS — Z79899 Other long term (current) drug therapy: Secondary | ICD-10-CM | POA: Insufficient documentation

## 2017-06-03 DIAGNOSIS — F17210 Nicotine dependence, cigarettes, uncomplicated: Secondary | ICD-10-CM | POA: Insufficient documentation

## 2017-06-03 DIAGNOSIS — Y929 Unspecified place or not applicable: Secondary | ICD-10-CM | POA: Diagnosis not present

## 2017-06-03 DIAGNOSIS — S199XXA Unspecified injury of neck, initial encounter: Secondary | ICD-10-CM | POA: Diagnosis present

## 2017-06-03 MED ORDER — IBUPROFEN 600 MG PO TABS: 600.0000 mg | ORAL_TABLET | Freq: Three times a day (TID) | ORAL | 0 refills | Status: DC | PRN

## 2017-06-03 MED ORDER — CYCLOBENZAPRINE HCL 10 MG PO TABS: 10.0000 mg | ORAL_TABLET | Freq: Three times a day (TID) | ORAL | 0 refills | Status: DC | PRN
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
## 2017-10-06 ENCOUNTER — Emergency Department (HOSPITAL_COMMUNITY): Admit: 2017-10-06 | Payer: Medicare Other | Source: Home / Self Care

## 2017-10-06 ENCOUNTER — Other Ambulatory Visit: Payer: Self-pay

## 2017-10-20 ENCOUNTER — Ambulatory Visit (HOSPITAL_COMMUNITY)
Admission: EM | Admit: 2017-10-20 | Discharge: 2017-10-20 | Disposition: A | Discharge status: Home / Self Care | Payer: Medicare Other | Attending: Internal Medicine | Admitting: Internal Medicine

## 2017-10-20 ENCOUNTER — Encounter (HOSPITAL_COMMUNITY): Payer: Self-pay | Admitting: Family Medicine

## 2017-10-20 ENCOUNTER — Ambulatory Visit (INDEPENDENT_AMBULATORY_CARE_PROVIDER_SITE_OTHER): Payer: Medicare Other

## 2017-10-20 DIAGNOSIS — M25531 Pain in right wrist: Secondary | ICD-10-CM

## 2017-10-20 MED ORDER — NAPROXEN 500 MG PO TABS: 500.0000 mg | ORAL_TABLET | Freq: Two times a day (BID) | ORAL | 0 refills | Status: DC

## 2017-10-20 NOTE — Discharge Instructions (Signed)
Apply ice and elevate, especially at night. Naproxen twice a day, take with food. Ace wrap may provide comfort and compression.  Range of motion and rehab exercises provided, please see and practice May follow up with your primary care provider or orthopedics if your symptoms worsen or do not improve in the next 1-2 weeks.
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
## 2019-04-12 ENCOUNTER — Other Ambulatory Visit: Payer: Self-pay

## 2019-04-12 ENCOUNTER — Emergency Department (HOSPITAL_COMMUNITY): Payer: Medicare Other

## 2019-04-12 ENCOUNTER — Emergency Department (HOSPITAL_COMMUNITY)
Admission: EM | Admit: 2019-04-12 | Discharge: 2019-04-12 | Disposition: A | Discharge status: Home / Self Care | Payer: Medicare Other | Attending: Emergency Medicine | Admitting: Emergency Medicine

## 2019-04-12 DIAGNOSIS — I10 Essential (primary) hypertension: Secondary | ICD-10-CM | POA: Diagnosis not present

## 2019-04-12 DIAGNOSIS — F419 Anxiety disorder, unspecified: Secondary | ICD-10-CM | POA: Insufficient documentation

## 2019-04-12 DIAGNOSIS — J45909 Unspecified asthma, uncomplicated: Secondary | ICD-10-CM | POA: Insufficient documentation

## 2019-04-12 DIAGNOSIS — R51 Headache: Secondary | ICD-10-CM | POA: Diagnosis present

## 2019-04-12 DIAGNOSIS — F1210 Cannabis abuse, uncomplicated: Secondary | ICD-10-CM | POA: Diagnosis not present

## 2019-04-12 DIAGNOSIS — F319 Bipolar disorder, unspecified: Secondary | ICD-10-CM | POA: Insufficient documentation

## 2019-04-12 DIAGNOSIS — F250 Schizoaffective disorder, bipolar type: Secondary | ICD-10-CM | POA: Diagnosis not present

## 2019-04-12 DIAGNOSIS — F17210 Nicotine dependence, cigarettes, uncomplicated: Secondary | ICD-10-CM | POA: Insufficient documentation

## 2019-04-12 DIAGNOSIS — R519 Headache, unspecified: Secondary | ICD-10-CM

## 2019-04-12 LAB — BASIC METABOLIC PANEL
Anion gap: 10 (ref 5–15)
BUN: 11 mg/dL (ref 6–20)
CO2: 25 mmol/L (ref 22–32)
Calcium: 9.2 mg/dL (ref 8.9–10.3)
Chloride: 103 mmol/L (ref 98–111)
Creatinine, Ser: 1.38 mg/dL — ABNORMAL HIGH (ref 0.61–1.24)
Glucose, Bld: 102 mg/dL — ABNORMAL HIGH (ref 70–99)
Potassium: 4.5 mmol/L (ref 3.5–5.1)
Sodium: 138 mmol/L (ref 135–145)

## 2019-04-12 LAB — RAPID URINE DRUG SCREEN, HOSP PERFORMED
Amphetamines: NOT DETECTED
Barbiturates: NOT DETECTED
Benzodiazepines: NOT DETECTED
Cocaine: NOT DETECTED
Opiates: NOT DETECTED
Tetrahydrocannabinol: POSITIVE — AB

## 2019-04-12 LAB — CBC
HCT: 46.9 % (ref 39.0–52.0)
Hemoglobin: 15.6 g/dL (ref 13.0–17.0)
MCH: 34.8 pg — ABNORMAL HIGH (ref 26.0–34.0)
MCHC: 33.3 g/dL (ref 30.0–36.0)
MCV: 104.7 fL — ABNORMAL HIGH (ref 80.0–100.0)
Platelets: 204 K/uL (ref 150–400)
RBC: 4.48 MIL/uL (ref 4.22–5.81)
RDW: 11.6 % (ref 11.5–15.5)
WBC: 7.2 K/uL (ref 4.0–10.5)
nRBC: 0 % (ref 0.0–0.2)

## 2019-04-12 LAB — VALPROIC ACID LEVEL: Valproic Acid Lvl: <10 ug/mL — ABNORMAL LOW (ref 50.0–100.0)

## 2019-04-12 LAB — ETHANOL: Alcohol, Ethyl (B): <10 mg/dL (ref <10)

## 2019-04-12 MED ORDER — SODIUM CHLORIDE 0.9 % IV BOLUS
1000.0000 mL | Freq: Once | INTRAVENOUS | Status: AC
  Administered 2019-04-12: 1000 mL via INTRAVENOUS

## 2019-04-12 MED ORDER — KETOROLAC TROMETHAMINE 15 MG/ML IJ SOLN
15.0000 mg | Freq: Once | INTRAMUSCULAR | Status: AC
  Administered 2019-04-12: 15 mg via INTRAVENOUS
  Filled 2019-04-12: qty 1

## 2019-04-12 MED ORDER — DIVALPROEX SODIUM ER 500 MG PO TB24: 1500.0000 mg | ORAL_TABLET | Freq: Every evening | ORAL | Status: DC

## 2019-04-12 MED ORDER — ACETAMINOPHEN 325 MG PO TABS: 650.0000 mg | ORAL_TABLET | ORAL | Status: DC | PRN

## 2019-04-12 MED ORDER — ALBUTEROL SULFATE HFA 108 (90 BASE) MCG/ACT IN AERS: 1.0000 | INHALATION_SPRAY | Freq: Four times a day (QID) | RESPIRATORY_TRACT | Status: DC | PRN

## 2019-04-12 MED ORDER — METOCLOPRAMIDE HCL 5 MG/ML IJ SOLN
10.0000 mg | Freq: Once | INTRAMUSCULAR | Status: AC
Start: 2019-04-12 — End: 2019-04-12
  Administered 2019-04-12: 10 mg via INTRAVENOUS
  Filled 2019-04-12: qty 2

## 2019-04-12 MED ORDER — NICOTINE 21 MG/24HR TD PT24: 21.0000 mg | MEDICATED_PATCH | Freq: Every day | TRANSDERMAL | Status: DC

## 2019-04-12 MED ORDER — ONDANSETRON HCL 4 MG PO TABS: 4.0000 mg | ORAL_TABLET | Freq: Three times a day (TID) | ORAL | Status: DC | PRN

## 2019-04-12 MED ORDER — SODIUM CHLORIDE 0.9 % IV SOLN: INTRAVENOUS | Status: DC

## 2019-04-12 MED ORDER — DIPHENHYDRAMINE HCL 50 MG/ML IJ SOLN
12.5000 mg | Freq: Once | INTRAMUSCULAR | Status: AC
  Administered 2019-04-12: 12.5 mg via INTRAVENOUS
  Filled 2019-04-12: qty 1

## 2019-04-12 MED ORDER — QUETIAPINE FUMARATE 300 MG PO TABS: 600.0000 mg | ORAL_TABLET | Freq: Every evening | ORAL | Status: DC

## 2019-04-12 MED ORDER — ALUM & MAG HYDROXIDE-SIMETH 200-200-20 MG/5ML PO SUSP: 30.0000 mL | Freq: Four times a day (QID) | ORAL | Status: DC | PRN

## 2019-04-12 NOTE — ED Provider Notes (Addendum)
[facility] Note   CSN: [id number] Arrival date & time: 04/12/19  1920    History   Chief Complaint Chief Complaint  Patient presents with  . Headache    HPI George Kelley is a 38 y.o. male.     Pt presents to the ED today with headache.  He said he has bipolar d/o and schizophrenia.  He said he's been compliant with his meds.  He feels like he's been under a lot of stress.  He got into an argument with his neighbors this evening and became overwhelmed.  He requested transport to the ED.  The h/a has been there for about 1 week.     Past Medical History:  Diagnosis Date  . Asthma   . Bipolar affective (HCC)   . Hypertension   . Schizophrenia [facility](HCC)     Patient Active Problem List   Diagnosis Date Noted  . Schizoaffective disorder, bipolar type (HCC) 03/09/2016    Past Surgical History:  Procedure Laterality Date  . head injury    . Left knee          Home Medications    Prior to Admission medications   Medication Sig Start Date End Date Taking? Authorizing Provider  albuterol (PROVENTIL HFA;VENTOLIN HFA) 108 (90 Base) MCG/ACT inhaler Inhale 1-2 puffs into the lungs every 6 (six) hours as needed for wheezing or shortness of breath. 10/14/16   Espina, Lucita LoraFrancisco Manuel, PA  cyclobenzaprine (FLEXERIL) 10 MG tablet Take 1 tablet (10 mg total) by mouth 3 (three) times daily as needed for muscle spasms. 06/03/17   Pricilla LovelessGoldston, Scott, MD  divalproex (DEPAKOTE ER) 500 MG 24 hr tablet Take 3 tablets (1,500 mg total) by mouth every evening. 03/12/16   Adonis BrookAgustin, Sheila, NP  ibuprofen (ADVIL,MOTRIN) 600 MG tablet Take 1 tablet (600 mg total) by mouth every 8 (eight) hours as needed. 06/03/17   Pricilla LovelessGoldston, Scott, MD  naproxen (NAPROSYN) 500 MG tablet Take 1 tablet (500 mg total) by mouth 2 (two) times daily. 10/20/17   Georgetta HaberBurky, Natalie B, NP  nicotine (NICODERM CQ - DOSED IN MG/24 HOURS) 21 mg/24hr patch Place 1 patch (21 mg total) onto  the skin daily. Patient not taking: Reported on 10/14/2016 03/12/16   Adonis BrookAgustin, Sheila, NP  QUEtiapine (SEROQUEL) 300 MG tablet Take 2 tablets (600 mg total) by mouth every evening. 03/12/16   Adonis BrookAgustin, Sheila, NP    Family History Family History  Problem Relation Age of Onset  . Cancer Mother   . CAD Mother     Social History Social History   Tobacco Use  . Smoking status: Current Every Day Smoker    Packs/day: 1.00    Types: Cigarettes  . Smokeless tobacco: Never Used  Substance Use Topics  . Alcohol use: Yes  . Drug use: Yes    Types: Marijuana     Allergies   Pork-derived products and Tomato   Review of Systems Review of Systems  Neurological: Positive for headaches.  Psychiatric/Behavioral: The patient is nervous/anxious.   All other systems reviewed and are negative.    Physical Exam Updated Vital Signs BP 131/90   Pulse 73   Resp (!) 26   Ht 6\' 5"  (1.956 m)   Wt 89.8 kg   SpO2 96%   BMI 23.48 kg/m   Physical Exam Vitals signs and nursing note reviewed.  Constitutional:      Appearance: He is well-developed.  HENT:     Head: Normocephalic and  atraumatic.     Mouth/Throat:     Mouth: Mucous membranes are moist.  Eyes:     Extraocular Movements: Extraocular movements intact.     Pupils: Pupils are equal, round, and reactive to light.  Neck:     Musculoskeletal: Normal range of motion and neck supple.  Cardiovascular:     Rate and Rhythm: Normal rate and regular rhythm.  Pulmonary:     Effort: Pulmonary effort is normal.     Breath sounds: Normal breath sounds.  Abdominal:     General: Bowel sounds are normal.     Palpations: Abdomen is soft.  Musculoskeletal: Normal range of motion.  Skin:    General: Skin is warm and dry.     Capillary Refill: Capillary refill takes less than 2 seconds.  Neurological:     Mental Status: He is alert and oriented to person, place, and time.  Psychiatric:        Mood and Affect: Mood normal.        Speech:  Speech normal.        Behavior: Behavior normal.        Thought Content: Thought content is paranoid.      ED Treatments / Results  Labs (all labs ordered are listed, but only abnormal results are displayed) Labs Reviewed  CBC - Abnormal; Notable for the following components:      Result Value   MCV 104.7 (*)    MCH 34.8 (*)    All other components within normal limits  BASIC METABOLIC PANEL - Abnormal; Notable for the following components:   Glucose, Bld 102 (*)    Creatinine, Ser 1.38 (*)    All other components within normal limits  RAPID URINE DRUG SCREEN, HOSP PERFORMED - Abnormal; Notable for the following components:   Tetrahydrocannabinol POSITIVE (*)    All other components within normal limits  ETHANOL  VALPROIC ACID LEVEL    EKG EKG Interpretation  Date/Time:  Thursday April 12 2019 19:29:42 EDT Ventricular Rate:  93 PR Interval:    QRS Duration: 89 QT Interval:  349 QTC Calculation: 435 R Axis:   50 Text Interpretation:  Sinus rhythm Confirmed by Jacalyn LefevreHaviland, Arrow Emmerich [phone]([id number]) on 04/12/2019 7:38:33 PM   Radiology Ct Head Wo Contrast  Result Date: 04/12/2019 CLINICAL DATA:  Headache and numbness. EXAM: CT HEAD WITHOUT CONTRAST TECHNIQUE: Contiguous axial images were obtained from the base of the skull through the vertex without intravenous contrast. COMPARISON:  None. FINDINGS: Brain: No evidence of acute infarction, hemorrhage, hydrocephalus, extra-axial collection or mass lesion/mass effect. Vascular: No hyperdense vessel or unexpected calcification. Skull: Normal. Negative for fracture or focal lesion. Sinuses/Orbits: There is mild mucosal thickening of the maxillary sinuses. The remaining paranasal sinuses and mastoid air cells are essentially clear. Other: None. IMPRESSION: No acute intracranial abnormality Electronically Signed   By: Katherine Mantlehristopher  Green M.D.   On: 04/12/2019 20:24    Procedures Procedures (including critical care time)  Medications Ordered in  ED Medications  sodium chloride 0.9 % bolus 1,000 mL (1,000 mLs Intravenous New Bag/Given 04/12/19 1953)    And  0.9 %  sodium chloride infusion (has no administration in time range)  acetaminophen (TYLENOL) tablet 650 mg (has no administration in time range)  ondansetron (ZOFRAN) tablet 4 mg (has no administration in time range)  alum & mag hydroxide-simeth (MAALOX/MYLANTA) 200-200-20 MG/5ML suspension 30 mL (has no administration in time range)  nicotine (NICODERM CQ - dosed in mg/24 hours) patch 21 mg (has no  administration in time range)  albuterol (VENTOLIN HFA) 108 (90 Base) MCG/ACT inhaler 1-2 puff (has no administration in time range)  divalproex (DEPAKOTE ER) 24 hr tablet 1,500 mg (has no administration in time range)  QUEtiapine (SEROQUEL) tablet 600 mg (has no administration in time range)  metoCLOPramide (REGLAN) injection 10 mg (10 mg Intravenous Given 04/12/19 1952)  diphenhydrAMINE (BENADRYL) injection 12.5 mg (12.5 mg Intravenous Given 04/12/19 1953)  ketorolac (TORADOL) 15 MG/ML injection 15 mg (15 mg Intravenous Given 04/12/19 1952)     Initial Impression / Assessment and Plan / ED Course  I have reviewed the triage vital signs and the nursing notes.  Pertinent labs & imaging results that were available during my care of the patient were reviewed by me and considered in my medical decision making (see chart for details).       Pt's headache is feeling much better.  TTS will be consulted.  Disposition pending per TTS.  He is not hi or si and is currently voluntary.  Pt changed his mind and no longer wants to stay for MH eval.  He is not si or hi, so he can go home.  [facility] with [facility].  Final Clinical Impressions(s) / ED Diagnoses   Final diagnoses:  Acute nonintractable headache, unspecified headache type  Schizoaffective disorder, bipolar type [facility])    ED Discharge Orders    None       Isla Pence, MD 04/12/19 2101    Isla Pence, MD 04/12/19  2228

## 2019-04-12 NOTE — BHH Counselor (Signed)
Clinician noted from [place], RN pt did not want to get COVID tested and wanted to leave the ED. No TTS assessment is needed.     Vertell Novak, [place], [facility], [facility] Triage Specialist [phone]

## 2019-04-12 NOTE — ED Triage Notes (Signed)
BIB EMS from home. Per EMS they were called by [employer]. Pt was having a dispute with his neighbors and he became overwhelmed and requested transport to the ED. Pt reports HA/numbness X1 week. No neuro deficits. Pt has extensive psychiatric history. VSS. A/OX4.

## 2019-04-12 NOTE — ED Notes (Signed)
Patient transported to CT 

## 2019-04-12 NOTE — ED Notes (Signed)
Notified pt he has to be tested for COVID and he refused. I told pt he could not stay here at a voluntary commit or be admitted w/o being tested and he said he would go home.

## 2019-09-06 ENCOUNTER — Emergency Department (HOSPITAL_COMMUNITY)
Admission: EM | Admit: 2019-09-06 | Discharge: 2019-09-06 | Disposition: A | Discharge status: Home / Self Care | Payer: Medicare Other | Attending: Emergency Medicine | Admitting: Emergency Medicine

## 2019-09-06 ENCOUNTER — Other Ambulatory Visit: Payer: Self-pay

## 2019-09-06 ENCOUNTER — Emergency Department (HOSPITAL_COMMUNITY): Payer: Medicare Other

## 2019-09-06 DIAGNOSIS — Z79899 Other long term (current) drug therapy: Secondary | ICD-10-CM | POA: Insufficient documentation

## 2019-09-06 DIAGNOSIS — F17210 Nicotine dependence, cigarettes, uncomplicated: Secondary | ICD-10-CM | POA: Diagnosis not present

## 2019-09-06 DIAGNOSIS — Z8709 Personal history of other diseases of the respiratory system: Secondary | ICD-10-CM | POA: Insufficient documentation

## 2019-09-06 DIAGNOSIS — F250 Schizoaffective disorder, bipolar type: Secondary | ICD-10-CM | POA: Diagnosis not present

## 2019-09-06 DIAGNOSIS — R0789 Other chest pain: Secondary | ICD-10-CM | POA: Insufficient documentation

## 2019-09-06 DIAGNOSIS — I10 Essential (primary) hypertension: Secondary | ICD-10-CM

## 2019-09-06 DIAGNOSIS — R079 Chest pain, unspecified: Secondary | ICD-10-CM

## 2019-09-06 LAB — CBC
HCT: 50.4 % (ref 39.0–52.0)
Hemoglobin: 16.6 g/dL (ref 13.0–17.0)
MCH: 35 pg — ABNORMAL HIGH (ref 26.0–34.0)
MCHC: 32.9 g/dL (ref 30.0–36.0)
MCV: 106.3 fL — ABNORMAL HIGH (ref 80.0–100.0)
Platelets: 211 10*3/uL (ref 150–400)
RBC: 4.74 MIL/uL (ref 4.22–5.81)
RDW: 11.9 % (ref 11.5–15.5)
WBC: 8.1 10*3/uL (ref 4.0–10.5)
nRBC: 0 % (ref 0.0–0.2)

## 2019-09-06 LAB — BASIC METABOLIC PANEL
Anion gap: 12 (ref 5–15)
BUN: 11 mg/dL (ref 6–20)
CO2: 26 mmol/L (ref 22–32)
Calcium: 9.6 mg/dL (ref 8.9–10.3)
Chloride: 102 mmol/L (ref 98–111)
Creatinine, Ser: 1.36 mg/dL — ABNORMAL HIGH (ref 0.61–1.24)
GFR calc Af Amer: >60 mL/min (ref >60)
GFR calc non Af Amer: >60 mL/min (ref >60)
Glucose, Bld: 104 mg/dL — ABNORMAL HIGH (ref 70–99)
Potassium: 4.1 mmol/L (ref 3.5–5.1)
Sodium: 140 mmol/L (ref 135–145)

## 2019-09-06 LAB — ACETAMINOPHEN LEVEL: Acetaminophen (Tylenol), Serum: <10 ug/mL — ABNORMAL LOW (ref 10–30)

## 2019-09-06 LAB — RAPID URINE DRUG SCREEN, HOSP PERFORMED
Amphetamines: NOT DETECTED
Barbiturates: NOT DETECTED
Benzodiazepines: NOT DETECTED
Cocaine: NOT DETECTED
Opiates: NOT DETECTED
Tetrahydrocannabinol: NOT DETECTED

## 2019-09-06 LAB — TROPONIN I (HIGH SENSITIVITY)
Troponin I (High Sensitivity): 8 ng/L (ref <18)
Troponin I (High Sensitivity): 7 ng/L (ref <18)

## 2019-09-06 LAB — SALICYLATE LEVEL: Salicylate Lvl: <7 mg/dL (ref 2.8–30.0)

## 2019-09-06 LAB — ETHANOL: Alcohol, Ethyl (B): <10 mg/dL (ref <10)

## 2019-09-06 MED ORDER — AMLODIPINE BESYLATE 5 MG PO TABS
5.0000 mg | ORAL_TABLET | Freq: Once | ORAL | Status: AC
  Administered 2019-09-06: 5 mg via ORAL
  Filled 2019-09-06: qty 1

## 2019-09-06 MED ORDER — ALBUTEROL SULFATE HFA 108 (90 BASE) MCG/ACT IN AERS
2.0000 | INHALATION_SPRAY | Freq: Once | RESPIRATORY_TRACT | Status: AC
  Administered 2019-09-06: 2 via RESPIRATORY_TRACT
  Filled 2019-09-06: qty 6.7

## 2019-09-06 MED ORDER — DICLOFENAC SODIUM 1 % EX GEL: 2.0000 g | Freq: Four times a day (QID) | CUTANEOUS | 0 refills | Status: AC

## 2019-09-06 MED ORDER — ACETAMINOPHEN 325 MG PO TABS
650.0000 mg | ORAL_TABLET | Freq: Once | ORAL | Status: AC
  Administered 2019-09-06: 650 mg via ORAL
  Filled 2019-09-06: qty 2

## 2019-09-06 MED ORDER — AMLODIPINE BESYLATE 5 MG PO TABS: 5.0000 mg | ORAL_TABLET | Freq: Every day | ORAL | 0 refills | Status: DC

## 2019-09-06 NOTE — Discharge Instructions (Addendum)
Take the amlodipine as directed for your blood pressure.  Use of Voltaren gel as directed for the pain in your chest wall.  Please contact the primary care provider listed on your discharge paperwork to make an appointment for follow-up in regards to your symptoms and your high blood pressure today.

## 2019-09-06 NOTE — ED Provider Notes (Signed)
Accepted handoff at shift change from [facility] Please see prior provider note for more detail.   S: 38 y.o. male with history of bipolar, schizophrenia presenting with chest pain  PE/labs: Patient of benign physical exam.  Nonischemic EKG with T wave abnormalities that are unchanged from prior EKG and normal troponins.  Blood work unremarkable.  Chest x-ray without abnormal findings or acute findings.    Briefly: Patient states he is in ED because he is stressed and tired.  Per prior evaluator patient makes multiple paranoid remarks during evaluation.  Reports chronic hallucinations.  Denies SI, HI.  Has reproducible pain for the past 3 days feels a tightness which, pleurisy, ulcer,.  No infectious symptoms.  Patient reproducible chest wall pain.  Provider has medically cleared and consulted TTS.  Pending TTS eval will likely discharge.  Per prior evaluator for IVC if patient decides to leave he may.    Physical Exam  BP (!) 163/101   Pulse 86   Temp 98.4 F (36.9 C) (Oral)   Resp 18   SpO2 96%   CONSTITUTIONAL:  well-appearing, NAD NEURO:  Alert and oriented x 3, no focal deficits EYES:  pupils equal and reactive ENT/NECK:  trachea midline, no JVD CARDIO:  reg rate, reg rhythm, well-perfused, tenderness to light palpation over the anterior chest wall. PULM:  None labored breathing on NRB GI/GU:  Abdomin non-distended MSK/SPINE:  No gross deformities, no edema SKIN:  no rash, atraumatic PSYCH:  Appropriate speech and behavior   ED Course/Procedures     Procedures  MDM   38 year old male with history of hypertension, bipolar, schizophrenia presenting with chest pain, anxiety, stress likely related to questions references to not wanting cops in his room, etc.  Pending TTS consult will discharge   Patient reassessed at 5:15 PM patient would like to leave at this time.  Patient denies SI, HI, does not appear to be acutely manic.  Is agitated however patient states  he feels at his baseline.  Does not seem to be a harm to himself or others per prior provider he is not IVCed.  Will discharge home with blood pressure control and Voltaren gel.  [place] consult Tinnie Gens, NP recommends D/C and follow-up with current providers.   Pati Gallo [place], [place] 09/06/19 2159    Gareth Morgan, MD 09/07/19 1229

## 2019-09-06 NOTE — BH Assessment (Signed)
Tele Assessment Note   Patient Name: George CoyerBranden Kelley MRN: [id number] Referring Physician: Dr. Dalene SeltzerSchlossman [facility] Location of Provider: [facility]al [facility] TTS Department  George CoyerBranden Kelley is an 38 y.o. male. Pt denies SI/HI and AVH. Per Pt he has been diagnosed with Schizoaffective Bipolar type. The Pt states he came to the hospital because he was stressed about a issue with his neighbors. Per Pt the neighbors are harassing her and his wife and he is tired of the harassment. Per Pt he has contacted the police and attorneys. Per Pt the police are not taking the allegations serious and it has been very upsetting to him. The Pt states that he and his wife are sought help from  "everywhere." The Pt states he is compliant with his medication.  Pt reports previous hospitalizations. Per Pt his last hospitalization was 3 years ago. Pt denies SA. Pt states he is seeking someone to help him with the conflict with his neighbors.   Collateral contact from the Pt's wife George Kelley [phone]. Per George Kelley she and the Pt has having issues with the neighbors and the police are not helping them. George Kelley states that the she and Pt have been stressed about his situation. George Kelley does not feel the Pt is a risk to himself or others. Per George Kelley "we need help the legal system."  This Clinical research associatewriter provided [place] for legal aid of [place].  Garlan FillersLaShunda, NP recommends D/C and follow-up with current providers. Diagnosis:  F20.9 Schizophrenia  Past Medical History:  Past Medical History:  Diagnosis Date  . Asthma   . Bipolar affective (HCC)   . Hypertension   . Schizophrenia [facility](HCC)     Past Surgical History:  Procedure Laterality Date  . head injury    . Left knee      Family History:  Family History  Problem Relation Age of Onset  . Cancer Mother   . CAD Mother     Social History:  reports that he has been smoking cigarettes. He has been smoking about  1.00 pack per day. He has never used smokeless tobacco. He reports current alcohol use. He reports current drug use. Drug: Marijuana.  Additional Social History:  Alcohol / Drug Use Pain Medications: please Prescriptions: please see mar Over the Counter: please see mar History of alcohol / drug use?: No history of alcohol / drug abuse Longest period of sobriety (when/how long): unknown  CIWA: CIWA-Ar BP: (!) 163/101 Pulse Rate: 86 COWS:    Allergies:  Allergies  Allergen Reactions  . Pork-Derived Products     Pt states he doesn't Eat pork at all due to religious preference   . Tomato Swelling    Home Medications: (Not in a hospital admission)   OB/GYN Status:  No LMP for male patient.  General Assessment Data [facility] TTS Assessment: In system Is this a Tele or Face-to-Face Assessment?: Tele Assessment Is this an Initial Assessment or a Re-assessment for this encounter?: Initial Assessment Patient Accompanied by:: N/A Language Other than English: No Living Arrangements: Other (Comment) What gender do you identify as?: Male Marital status: Single Maiden name: NA Pregnancy Status: No Living Arrangements: Spouse/significant other Can pt return to current living arrangement?: Yes Admission Status: Voluntary Is patient capable of signing voluntary admission?: Yes Referral Source: Self/Family/Friend Insurance type: Medicare     Crisis Care Plan Living Arrangements: Spouse/significant other Legal Guardian: Other:(self) Name of Psychiatrist: [facility] Name of Therapist: [facility]  Education Status Is patient  currently in school?: No Is the patient employed, unemployed or receiving disability?: Unemployed  Risk to self with the past 6 months Suicidal Ideation: No Has patient been a risk to self within the past 6 months prior to admission? : No Suicidal Intent: No Has patient had any suicidal intent within the past 6 months prior to admission? :  No Is patient at risk for suicide?: No Suicidal Plan?: No Has patient had any suicidal plan within the past 6 months prior to admission? : No Access to Means: No What has been your use of drugs/alcohol within the last 12 months?: NA Previous Attempts/Gestures: No How many times?: 0 Other Self Harm Risks: NA Triggers for Past Attempts: None known Intentional Self Injurious Behavior: None Family Suicide History: No Recent stressful life event(s): Conflict (Comment) Persecutory voices/beliefs?: No Depression: Yes Depression Symptoms: Feeling angry/irritable Substance abuse history and/or treatment for substance abuse?: No Suicide prevention information given to non-admitted patients: Not applicable  Risk to Others within the past 6 months Homicidal Ideation: No Does patient have any lifetime risk of violence toward others beyond the six months prior to admission? : No Thoughts of Harm to Others: No Current Homicidal Intent: No Current Homicidal Plan: No Access to Homicidal Means: No Identified Victim: NA History of harm to others?: No Assessment of Violence: None Noted Violent Behavior Description: NA Does patient have access to weapons?: No Criminal Charges Pending?: No Does patient have a court date: No Is patient on probation?: No  Psychosis Hallucinations: None noted Delusions: None noted  Mental Status Report Appearance/Hygiene: In scrubs Eye Contact: Fair Motor Activity: Freedom of movement Speech: Logical/coherent Level of Consciousness: Alert Mood: Angry Affect: Angry Anxiety Level: None Thought Processes: Coherent, Relevant Judgement: Unimpaired Orientation: Person, Place, Situation, Time Obsessive Compulsive Thoughts/Behaviors: None  Cognitive Functioning Concentration: Normal Memory: Recent Intact, Remote Intact Is patient IDD: No Insight: Fair Impulse Control: Fair Appetite: Fair Have you had any weight changes? : No Change Sleep: No Change Total  Hours of Sleep: 8 Vegetative Symptoms: None  ADLScreening [facility] Assessment Services) Patient's cognitive ability adequate to safely complete daily activities?: Yes Patient able to express need for assistance with ADLs?: Yes Independently performs ADLs?: Yes (appropriate for developmental age)  Prior Inpatient Therapy Prior Inpatient Therapy: Yes Prior Therapy Dates: unknown Prior Therapy Facilty/Provider(s): [facility] Reason for Treatment: Schizo-affective Bipolar  Prior Outpatient Therapy Prior Outpatient Therapy: Yes Prior Therapy Dates: current Prior Therapy Facilty/Provider(s): [facility] Reason for Treatment: Schizo-affective Does patient have an ACCT team?: No Does patient have Intensive In-House Services?  : No Does patient have [facility] services? : No Does patient have P4CC services?: No  ADL Screening (condition at time of admission) Patient's cognitive ability adequate to safely complete daily activities?: Yes Is the patient deaf or have difficulty hearing?: No Does the patient have difficulty seeing, even when wearing glasses/contacts?: No Does the patient have difficulty concentrating, remembering, or making decisions?: No Patient able to express need for assistance with ADLs?: Yes Does the patient have difficulty dressing or bathing?: No Independently performs ADLs?: Yes (appropriate for developmental age)       Abuse/Neglect Assessment (Assessment to be complete while patient is alone) Abuse/Neglect Assessment Can Be Completed: Yes Physical Abuse: Denies Verbal Abuse: Denies Sexual Abuse: Denies Exploitation of patient/patient's resources: Denies     Merchant navy officer (For Healthcare) Does Patient Have a Medical Advance Directive?: No Would patient like information on creating a medical advance directive?: No - Patient declined  Disposition:  Disposition Initial Assessment Completed for this Encounter: Yes  This service was provided via  telemedicine using a 2-way, interactive audio and video technology.  Names of all persons participating in this telemedicine service and their role in this encounter. Name: [facility] Role: wife  Name:  Role:   Name:  Role:   Name:  Role:     Cyndia Bent 09/06/2019 5:46 PM

## 2019-09-06 NOTE — ED Notes (Signed)
Preforming TTS now.

## 2019-09-06 NOTE — ED Notes (Signed)
Pt dc'd home w/all belongings, pt a/o x4, ambulatory upon dc
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
## 2019-09-06 NOTE — ED Triage Notes (Signed)
Pt BIB [facility], pt reports chest pain since yesterday. Pt reports having a headache that is "killing him". Pt reports he is also very stressed in his home life. Pt denies SHOB and light headedness. Pt denies N/V.

## 2019-09-06 NOTE — ED Notes (Signed)
Pt giving urine sample now.

## 2020-01-01 ENCOUNTER — Ambulatory Visit (HOSPITAL_COMMUNITY)
Admission: EM | Admit: 2020-01-01 | Discharge: 2020-01-01 | Disposition: A | Discharge status: Home / Self Care | Payer: Medicare Other | Attending: Family Medicine | Admitting: Family Medicine

## 2020-01-01 ENCOUNTER — Other Ambulatory Visit: Payer: Self-pay

## 2020-01-01 ENCOUNTER — Ambulatory Visit (INDEPENDENT_AMBULATORY_CARE_PROVIDER_SITE_OTHER): Payer: Medicare Other

## 2020-01-01 ENCOUNTER — Encounter (HOSPITAL_COMMUNITY): Payer: Self-pay

## 2020-01-01 DIAGNOSIS — M25561 Pain in right knee: Secondary | ICD-10-CM

## 2020-01-01 DIAGNOSIS — I10 Essential (primary) hypertension: Secondary | ICD-10-CM | POA: Diagnosis not present

## 2020-01-01 DIAGNOSIS — R05 Cough: Secondary | ICD-10-CM | POA: Diagnosis not present

## 2020-01-01 DIAGNOSIS — R0789 Other chest pain: Secondary | ICD-10-CM

## 2020-01-01 DIAGNOSIS — R079 Chest pain, unspecified: Secondary | ICD-10-CM

## 2020-01-01 MED ORDER — NAPROXEN 500 MG PO TABS: 500.0000 mg | ORAL_TABLET | Freq: Two times a day (BID) | ORAL | 0 refills | Status: DC

## 2020-01-01 MED ORDER — NICOTINE 21 MG/24HR TD PT24: 21.0000 mg | MEDICATED_PATCH | Freq: Every day | TRANSDERMAL | 0 refills | Status: DC

## 2020-01-01 NOTE — ED Triage Notes (Signed)
Pt is here with right knee pain that started 2 weeks ago, his chest pain has been on & off for a month.

## 2020-01-01 NOTE — Discharge Instructions (Addendum)
It appears that you have a bone spur on the knee.  This could be the cause of your discomfort.  You need to follow-up with orthopedics.  Your primary care can refer you there . Keep using the knee brace and I will give you some crutches to stay off the knee. Chest x-ray was normal. Naproxen for pain and inflammation in the knee. Rest, ice, elevate Follow-up with your primary care doctor as needed. Also sent in the nicotine patches as requested.

## 2020-01-01 NOTE — ED Provider Notes (Signed)
[place]    CSN: [id number] Arrival date & time: 01/01/20  1211      History   Chief Complaint Chief Complaint  Patient presents with  . Knee Pain    Right  . Chest Pain    HPI George Kelley is a 39 y.o. male.   Patient is a 39 year old male past medical history of asthma, bipolar, hypertension, schizophrenia.  He presents today with multiple complaints.  First complaint being right knee pain.  This started approximate 1 month ago.  The pain is worse in the last 2 weeks.  He has been wearing a knee brace.  Has not been taking any pain medication for the knee.  Pain is worse with flexion of the knee.  Reporting knee swells from time to time.  No numbness, tingling, erythema.  He is also been having off-and-on chest pain for the past month.  Currently not having any chest pain.  Describes it as sharp and stabbing when it comes.  The pain is usually brought on by stress and being upset.  The pain is resolved when the stress is decreased.  Denies any shortness of breath, dizziness, lightheadedness, nausea, vomiting, cough, chest congestion or fevers.  Patient reporting PCP sent him here for x-rays of his chest, knee.  He is also requesting crutches.  ROS per HPI      Past Medical History:  Diagnosis Date  . Asthma   . Bipolar affective ([place])   . Hypertension   . Schizophrenia [facility])     Patient Active Problem List   Diagnosis Date Noted  . Schizoaffective disorder, bipolar type ([place]) 03/09/2016    Past Surgical History:  Procedure Laterality Date  . head injury    . Left knee         Home Medications    Prior to Admission medications   Medication Sig Start Date End Date Taking? Authorizing Provider  albuterol (PROVENTIL HFA;VENTOLIN HFA) 108 (90 Base) MCG/ACT inhaler Inhale 1-2 puffs into the lungs every 6 (six) hours as needed for wheezing or shortness of breath. 10/14/16   Bettey Costa, PA  amLODipine (NORVASC) 5 MG tablet Take 1  tablet (5 mg total) by mouth daily. 09/06/19 10/06/19  Couture, Cortni S, PA-C  cyclobenzaprine (FLEXERIL) 10 MG tablet Take 1 tablet (10 mg total) by mouth 3 (three) times daily as needed for muscle spasms. 06/03/17   Sherwood Gambler, MD  divalproex (DEPAKOTE ER) 500 MG 24 hr tablet Take 3 tablets (1,500 mg total) by mouth every evening. 03/12/16   Kerrie Buffalo, NP  ibuprofen (ADVIL,MOTRIN) 600 MG tablet Take 1 tablet (600 mg total) by mouth every 8 (eight) hours as needed. 06/03/17   Sherwood Gambler, MD  naproxen (NAPROSYN) 500 MG tablet Take 1 tablet (500 mg total) by mouth 2 (two) times daily. 01/01/20   Galilee Pierron, Tressia Miners A, NP  nicotine (NICODERM CQ - DOSED IN MG/24 HOURS) 21 mg/24hr patch Place 1 patch (21 mg total) onto the skin daily. 01/01/20   Loura Halt A, NP  QUEtiapine (SEROQUEL) 300 MG tablet Take 2 tablets (600 mg total) by mouth every evening. 03/12/16   Kerrie Buffalo, NP    Family History Family History  Problem Relation Age of Onset  . Cancer Mother   . CAD Mother     Social History Social History   Tobacco Use  . Smoking status: Current Every Day Smoker    Packs/day: 1.00    Types: Cigarettes  . Smokeless tobacco: Never  Used  Substance Use Topics  . Alcohol use: Yes  . Drug use: Yes    Types: Marijuana     Allergies   Pork-derived products and Tomato   Review of Systems Review of Systems   Physical Exam Triage Vital Signs ED Triage Vitals  Enc Vitals Group     BP 01/01/20 1306 (!) 148/90     Pulse Rate 01/01/20 1306 96     Resp 01/01/20 1306 19     Temp 01/01/20 1306 98.6 F (37 C)     Temp Source 01/01/20 1306 Oral     SpO2 01/01/20 1306 98 %     Weight --      Height --      Head Circumference --      Peak Flow --      Pain Score 01/01/20 1302 5     Pain Loc --      Pain Edu? --      Excl. in GC? --    No data found.  Updated Vital Signs BP (!) 148/90 (BP Location: Right Arm)   Pulse 96   Temp 98.6 F (37 C) (Oral)   Resp 19   SpO2 98%    Visual Acuity Right Eye Distance:   Left Eye Distance:   Bilateral Distance:    Right Eye Near:   Left Eye Near:    Bilateral Near:     Physical Exam Vitals and nursing note reviewed.  Constitutional:      General: He is not in acute distress.    Appearance: Normal appearance. He is not ill-appearing, toxic-appearing or diaphoretic.  HENT:     Head: Normocephalic and atraumatic.     Nose: Nose normal.     Mouth/Throat:     Pharynx: Oropharynx is clear.  Eyes:     Conjunctiva/sclera: Conjunctivae normal.  Cardiovascular:     Rate and Rhythm: Normal rate and regular rhythm.     Pulses: Normal pulses.     Heart sounds: Normal heart sounds.  Pulmonary:     Effort: Pulmonary effort is normal.     Breath sounds: Normal breath sounds.  Musculoskeletal:        General: Swelling and tenderness present.     Cervical back: Normal range of motion.     Right knee: Bony tenderness present. No crepitus. Decreased range of motion. Tenderness present over the patellar tendon. Normal alignment and normal meniscus.  Neurological:     Mental Status: He is alert.      UC Treatments / Results  Labs (all labs ordered are listed, but only abnormal results are displayed) Labs Reviewed - No data to display  EKG   Radiology DG Chest 2 View  Result Date: 01/01/2020 CLINICAL DATA:  Chest pain and cough EXAM: CHEST - 2 VIEW COMPARISON:  September 06, 2019 FINDINGS: Mild apical pleural thickening is stable. Lungs elsewhere clear. Heart size and pulmonary vascularity are normal. No adenopathy. No pneumothorax. No bone lesions. IMPRESSION: Stable mild apical pleural thickening. Lungs otherwise clear. Cardiac silhouette normal. Electronically Signed   By: Bretta Bang III M.D.   On: 01/01/2020 14:23   DG Knee Complete 4 Views Right  Result Date: 01/01/2020 CLINICAL DATA:  Pain.  Fall approximately 1 month prior EXAM: RIGHT KNEE - COMPLETE 4+ VIEW COMPARISON:  None. FINDINGS: Frontal,  lateral, and bilateral oblique views were obtained. No evident fracture or dislocation. No joint effusion. Joint spaces appear normal. There is a spur along the anterior  superior patella. 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 No fracture, dislocation, or effusion. No appreciable joint space narrowing or erosion. Electronically Signed   By: Bretta Bang III M.D.   On: 01/01/2020 14:24    Procedures Procedures (including critical care time)  Medications Ordered in UC Medications - No data to display  Initial Impression / Assessment and Plan / UC Course  I have reviewed the triage vital signs and the nursing notes.  Pertinent labs & imaging results that were available during my care of the patient were reviewed by me and considered in my medical decision making (see chart for details).     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 No fracture, dislocation, or effusion. No appreciable joint space narrowing or erosion. Treating pain with naproxen  Given crutches as requested.    Chest pain- most  likely stress related, anxiety.  EKG with normal sinus rhythm, ST & T wave abnormality Hx of similar on previous EKGs.  Chest x ray normal.  ER precautions given if chest pain returns to go to the ER for evaluation. Patient understanding and agree. Recommended follow-up with his doctor for any continued or worsening problems Final Clinical Impressions(s) / UC Diagnoses   Final diagnoses:  None     Discharge Instructions     It appears that you have a bone spur on the knee.  This could be the cause of your discomfort.  You need to follow-up with orthopedics.  Your primary care can refer you there . Keep using the knee brace and I will give you some crutches to stay off the knee. Chest x-ray was normal. Naproxen for pain and inflammation in the knee. Rest,  ice, elevate Follow-up with your primary care doctor as needed. Also sent in the nicotine patches as requested.    ED Prescriptions    Medication Sig Dispense Auth. Provider   nicotine (NICODERM CQ - DOSED IN MG/24 HOURS) 21 mg/24hr patch Place 1 patch (21 mg total) onto the skin daily. 28 patch Caitriona Sundquist A, NP   naproxen (NAPROSYN) 500 MG tablet Take 1 tablet (500 mg total) by mouth 2 (two) times daily. 30 tablet Dahlia Byes A, NP     PDMP not reviewed this encounter.   Janace Aris, NP 01/02/20 1009

## 2020-01-01 NOTE — ED Notes (Signed)
Evaluated pt in waiting room. Pt sitting sideways in chair with arm over back; laughing and talking with registration clerk. Able to speak full sentences w/o difficulty. Denies SOB. No acute distress. SpO2 97% on RA.  Reports right knee pain and states "I always only drive with my left leg." Advised pt if CP worsens, if he develops SOB, sweating, n/v to notify staff STAT so we may bring him to tx room, otherwise pt will be seen ASAP in order of registration.

## 2020-03-24 ENCOUNTER — Encounter (HOSPITAL_COMMUNITY): Payer: Self-pay

## 2020-03-24 ENCOUNTER — Other Ambulatory Visit: Payer: Self-pay

## 2020-03-24 ENCOUNTER — Ambulatory Visit (HOSPITAL_COMMUNITY)
Admission: EM | Admit: 2020-03-24 | Discharge: 2020-03-24 | Disposition: A | Discharge status: Home / Self Care | Payer: Medicare Other | Attending: Physician Assistant | Admitting: Physician Assistant

## 2020-03-24 DIAGNOSIS — S161XXA Strain of muscle, fascia and tendon at neck level, initial encounter: Secondary | ICD-10-CM

## 2020-03-24 DIAGNOSIS — M62838 Other muscle spasm: Secondary | ICD-10-CM

## 2020-03-24 MED ORDER — IBUPROFEN 600 MG PO TABS: 600.0000 mg | ORAL_TABLET | Freq: Four times a day (QID) | ORAL | 0 refills | Status: DC | PRN

## 2020-03-24 MED ORDER — ACETAMINOPHEN 500 MG PO TABS: 1000.0000 mg | ORAL_TABLET | Freq: Four times a day (QID) | ORAL | 0 refills | Status: DC | PRN

## 2020-03-24 MED ORDER — TIZANIDINE HCL 4 MG PO TABS: 4.0000 mg | ORAL_TABLET | Freq: Four times a day (QID) | ORAL | 0 refills | Status: DC | PRN

## 2020-03-24 NOTE — Discharge Instructions (Addendum)
Take the ibuprofen every 6 hours Take the Tylenol every 8 hours You may take the Zanaflex up to every 6 hours, however this will make you sleepy so I recommend taking this primarily at night.  Do not drive within 6 to 8 hours of taking this, drink alcohol or operate machinery  Some soreness is expected, you should however improve over the next 7 to 10 days.  If not improving please follow-up with your primary care provider or return to this clinic
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
## 2020-08-07 ENCOUNTER — Encounter (HOSPITAL_COMMUNITY): Payer: Self-pay | Admitting: *Deleted

## 2020-08-07 ENCOUNTER — Emergency Department (HOSPITAL_COMMUNITY)
Admission: EM | Admit: 2020-08-07 | Discharge: 2020-08-07 | Disposition: A | Discharge status: Home / Self Care | Payer: Medicare Other | Attending: Emergency Medicine | Admitting: Emergency Medicine

## 2020-08-07 ENCOUNTER — Other Ambulatory Visit: Payer: Self-pay

## 2020-08-07 ENCOUNTER — Emergency Department (HOSPITAL_COMMUNITY): Payer: Medicare Other

## 2020-08-07 DIAGNOSIS — M25522 Pain in left elbow: Secondary | ICD-10-CM | POA: Insufficient documentation

## 2020-08-07 DIAGNOSIS — I10 Essential (primary) hypertension: Secondary | ICD-10-CM | POA: Insufficient documentation

## 2020-08-07 DIAGNOSIS — S53402A Unspecified sprain of left elbow, initial encounter: Secondary | ICD-10-CM

## 2020-08-07 DIAGNOSIS — F17210 Nicotine dependence, cigarettes, uncomplicated: Secondary | ICD-10-CM | POA: Diagnosis not present

## 2020-08-07 DIAGNOSIS — J45909 Unspecified asthma, uncomplicated: Secondary | ICD-10-CM | POA: Insufficient documentation

## 2020-08-07 DIAGNOSIS — Z79899 Other long term (current) drug therapy: Secondary | ICD-10-CM | POA: Insufficient documentation

## 2020-08-07 DIAGNOSIS — R202 Paresthesia of skin: Secondary | ICD-10-CM | POA: Diagnosis not present

## 2020-08-07 MED ORDER — NAPROXEN 375 MG PO TABS: 375.0000 mg | ORAL_TABLET | Freq: Two times a day (BID) | ORAL | 0 refills | Status: DC
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
## 2020-08-07 NOTE — Discharge Instructions (Addendum)
Get help right away if: You have severe pain. Your fingers turn white, very red, or cold and blue.

## 2020-11-25 ENCOUNTER — Encounter (HOSPITAL_COMMUNITY): Payer: Self-pay

## 2020-11-25 ENCOUNTER — Other Ambulatory Visit: Payer: Self-pay

## 2020-11-25 ENCOUNTER — Emergency Department (HOSPITAL_COMMUNITY): Payer: 59

## 2020-11-25 ENCOUNTER — Emergency Department (HOSPITAL_COMMUNITY)
Admission: EM | Admit: 2020-11-25 | Discharge: 2020-11-25 | Disposition: A | Discharge status: Home / Self Care | Payer: 59 | Attending: Emergency Medicine | Admitting: Emergency Medicine

## 2020-11-25 DIAGNOSIS — S6991XA Unspecified injury of right wrist, hand and finger(s), initial encounter: Secondary | ICD-10-CM | POA: Diagnosis present

## 2020-11-25 DIAGNOSIS — Z5321 Procedure and treatment not carried out due to patient leaving prior to being seen by health care provider: Secondary | ICD-10-CM | POA: Insufficient documentation

## 2020-11-25 DIAGNOSIS — X58XXXA Exposure to other specified factors, initial encounter: Secondary | ICD-10-CM | POA: Insufficient documentation
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
## 2020-11-27 ENCOUNTER — Ambulatory Visit (HOSPITAL_COMMUNITY)
Admission: EM | Admit: 2020-11-27 | Discharge: 2020-11-27 | Disposition: A | Discharge status: Home / Self Care | Payer: 59 | Attending: Emergency Medicine | Admitting: Emergency Medicine

## 2020-11-27 ENCOUNTER — Other Ambulatory Visit: Payer: Self-pay

## 2020-11-27 ENCOUNTER — Encounter (HOSPITAL_COMMUNITY): Payer: Self-pay | Admitting: Emergency Medicine

## 2020-11-27 DIAGNOSIS — M79601 Pain in right arm: Secondary | ICD-10-CM

## 2020-11-27 DIAGNOSIS — M25531 Pain in right wrist: Secondary | ICD-10-CM

## 2020-11-27 MED ORDER — IBUPROFEN 600 MG PO TABS: 600.0000 mg | ORAL_TABLET | Freq: Four times a day (QID) | ORAL | 0 refills | Status: DC | PRN
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
## 2020-11-27 NOTE — Discharge Instructions (Signed)
Take the ibuprofen as prescribed.  Rest and elevate your wrist and arm.  Apply ice packs 2-3 times a day for up to 20 minutes each.    Follow up with an orthopedist if your symptoms are not improving.

## 2021-02-10 ENCOUNTER — Other Ambulatory Visit: Payer: Self-pay

## 2021-02-10 ENCOUNTER — Ambulatory Visit (HOSPITAL_COMMUNITY)
Admission: EM | Admit: 2021-02-10 | Discharge: 2021-02-11 | Disposition: A | Discharge status: Other Acute Inpatient Hospital | Payer: 59 | Attending: Student in an Organized Health Care Education/Training Program | Admitting: Student in an Organized Health Care Education/Training Program

## 2021-02-10 DIAGNOSIS — Z20822 Contact with and (suspected) exposure to covid-19: Secondary | ICD-10-CM | POA: Insufficient documentation

## 2021-02-10 DIAGNOSIS — F1290 Cannabis use, unspecified, uncomplicated: Secondary | ICD-10-CM | POA: Insufficient documentation

## 2021-02-10 DIAGNOSIS — Z79899 Other long term (current) drug therapy: Secondary | ICD-10-CM | POA: Diagnosis not present

## 2021-02-10 DIAGNOSIS — F250 Schizoaffective disorder, bipolar type: Secondary | ICD-10-CM | POA: Insufficient documentation

## 2021-02-10 DIAGNOSIS — F17210 Nicotine dependence, cigarettes, uncomplicated: Secondary | ICD-10-CM | POA: Diagnosis not present

## 2021-02-10 DIAGNOSIS — R45850 Homicidal ideations: Secondary | ICD-10-CM | POA: Insufficient documentation

## 2021-02-10 LAB — POCT URINE DRUG SCREEN - MANUAL ENTRY (I-SCREEN)
POC Amphetamine UR: NOT DETECTED
POC Buprenorphine (BUP): NOT DETECTED
POC Cocaine UR: NOT DETECTED
POC Marijuana UR: NOT DETECTED
POC Methadone UR: NOT DETECTED
POC Methamphetamine UR: NOT DETECTED
POC Morphine: NOT DETECTED
POC Oxazepam (BZO): NOT DETECTED
POC Oxycodone UR: NOT DETECTED
POC Secobarbital (BAR): NOT DETECTED

## 2021-02-10 LAB — RESP PANEL BY RT-PCR (FLU A&B, COVID) ARPGX2
Influenza A by PCR: NEGATIVE
Influenza B by PCR: NEGATIVE
SARS Coronavirus 2 by RT PCR: NEGATIVE

## 2021-02-10 LAB — POC SARS CORONAVIRUS 2 AG
SARS Coronavirus 2 Ag: NEGATIVE
SARS Coronavirus 2 Ag: NEGATIVE

## 2021-02-10 LAB — COMPREHENSIVE METABOLIC PANEL
ALT: 29 U/L (ref 0–44)
AST: 27 U/L (ref 15–41)
Albumin: 4.5 g/dL (ref 3.5–5.0)
Alkaline Phosphatase: 70 U/L (ref 38–126)
Anion gap: 9 (ref 5–15)
BUN: 16 mg/dL (ref 6–20)
CO2: 26 mmol/L (ref 22–32)
Calcium: 9.8 mg/dL (ref 8.9–10.3)
Chloride: 100 mmol/L (ref 98–111)
Creatinine, Ser: 1.21 mg/dL (ref 0.61–1.24)
GFR, Estimated: >60 mL/min (ref >60)
Glucose, Bld: 95 mg/dL (ref 70–99)
Potassium: 5.1 mmol/L (ref 3.5–5.1)
Sodium: 135 mmol/L (ref 135–145)
Total Bilirubin: 0.7 mg/dL (ref 0.3–1.2)
Total Protein: 7.3 g/dL (ref 6.5–8.1)

## 2021-02-10 LAB — CBC WITH DIFFERENTIAL/PLATELET
Abs Immature Granulocytes: 0.03 10*3/uL (ref 0.00–0.07)
Basophils Absolute: 0.1 10*3/uL (ref 0.0–0.1)
Basophils Relative: 1 %
Eosinophils Absolute: 0 10*3/uL (ref 0.0–0.5)
Eosinophils Relative: 0 %
HCT: 51.4 % (ref 39.0–52.0)
Hemoglobin: 16.9 g/dL (ref 13.0–17.0)
Immature Granulocytes: 0 %
Lymphocytes Relative: 28 %
Lymphs Abs: 2.4 10*3/uL (ref 0.7–4.0)
MCH: 34.8 pg — ABNORMAL HIGH (ref 26.0–34.0)
MCHC: 32.9 g/dL (ref 30.0–36.0)
MCV: 105.8 fL — ABNORMAL HIGH (ref 80.0–100.0)
Monocytes Absolute: 0.7 10*3/uL (ref 0.1–1.0)
Monocytes Relative: 8 %
Neutro Abs: 5.4 10*3/uL (ref 1.7–7.7)
Neutrophils Relative %: 63 %
Platelets: 245 10*3/uL (ref 150–400)
RBC: 4.86 MIL/uL (ref 4.22–5.81)
RDW: 11.9 % (ref 11.5–15.5)
WBC: 8.5 10*3/uL (ref 4.0–10.5)
nRBC: 0 % (ref 0.0–0.2)

## 2021-02-10 LAB — HEMOGLOBIN A1C
Hgb A1c MFr Bld: 5.9 % — ABNORMAL HIGH (ref 4.8–5.6)
Mean Plasma Glucose: 122.63 mg/dL

## 2021-02-10 LAB — VALPROIC ACID LEVEL: Valproic Acid Lvl: <10 ug/mL — ABNORMAL LOW (ref 50.0–100.0)

## 2021-02-10 LAB — TSH: TSH: 2.005 u[IU]/mL (ref 0.350–4.500)

## 2021-02-10 MED ORDER — ACETAMINOPHEN 325 MG PO TABS
650.0000 mg | ORAL_TABLET | Freq: Four times a day (QID) | ORAL | Status: DC | PRN
  Administered 2021-02-10: 650 mg via ORAL
  Filled 2021-02-10: qty 2

## 2021-02-10 MED ORDER — AMLODIPINE BESYLATE 5 MG PO TABS
5.0000 mg | ORAL_TABLET | Freq: Every day | ORAL | Status: DC
  Administered 2021-02-10: 5 mg via ORAL
  Filled 2021-02-10: qty 1

## 2021-02-10 MED ORDER — LORAZEPAM 1 MG PO TABS: 1.0000 mg | ORAL_TABLET | ORAL | Status: DC | PRN

## 2021-02-10 MED ORDER — OLANZAPINE 10 MG PO TBDP: 10.0000 mg | ORAL_TABLET | Freq: Three times a day (TID) | ORAL | Status: DC | PRN

## 2021-02-10 MED ORDER — HYDROXYZINE HCL 25 MG PO TABS: 25.0000 mg | ORAL_TABLET | Freq: Three times a day (TID) | ORAL | Status: DC | PRN

## 2021-02-10 MED ORDER — MAGNESIUM HYDROXIDE 400 MG/5ML PO SUSP: 30.0000 mL | Freq: Every day | ORAL | Status: DC | PRN

## 2021-02-10 MED ORDER — DIVALPROEX SODIUM ER 500 MG PO TB24
750.0000 mg | ORAL_TABLET | Freq: Two times a day (BID) | ORAL | Status: DC
  Administered 2021-02-10: 750 mg via ORAL
  Filled 2021-02-10 (×2): qty 1

## 2021-02-10 MED ORDER — ZIPRASIDONE MESYLATE 20 MG IM SOLR: 20.0000 mg | INTRAMUSCULAR | Status: DC | PRN

## 2021-02-10 MED ORDER — TRAZODONE HCL 50 MG PO TABS: 50.0000 mg | ORAL_TABLET | Freq: Every evening | ORAL | Status: DC | PRN

## 2021-02-10 MED ORDER — RISPERIDONE 2 MG PO TBDP
2.0000 mg | ORAL_TABLET | Freq: Two times a day (BID) | ORAL | Status: DC
  Administered 2021-02-10 (×2): 2 mg via ORAL
  Filled 2021-02-10 (×2): qty 1

## 2021-02-10 MED ORDER — ALUM & MAG HYDROXIDE-SIMETH 200-200-20 MG/5ML PO SUSP: 30.0000 mL | ORAL | Status: DC | PRN

## 2021-02-10 MED ORDER — ALBUTEROL SULFATE HFA 108 (90 BASE) MCG/ACT IN AERS: 1.0000 | INHALATION_SPRAY | Freq: Four times a day (QID) | RESPIRATORY_TRACT | Status: DC | PRN

## 2021-02-10 MED ORDER — NICOTINE 21 MG/24HR TD PT24: 21.0000 mg | MEDICATED_PATCH | Freq: Every day | TRANSDERMAL | Status: DC

## 2021-02-10 MED ORDER — TIZANIDINE HCL 2 MG PO TABS: 4.0000 mg | ORAL_TABLET | Freq: Four times a day (QID) | ORAL | Status: DC | PRN

## 2021-02-10 MED ORDER — DIVALPROEX SODIUM ER 500 MG PO TB24: 500.0000 mg | ORAL_TABLET | Freq: Two times a day (BID) | ORAL | Status: DC

## 2021-02-10 NOTE — ED Notes (Signed)
Patient appears to be sleeping.  RR even and unlabored. 
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
## 2021-02-10 NOTE — Progress Notes (Signed)
Pt is admitted to OBS bed 5 due to HI towards the police and his neighbors.Pt is alert and oriented. Pt is ambulatory and is oriented to staff and unit. Pt is hyper-verbal and he is able to redirect at this time. Pt has brace on bilateral knees and was cooperative with skin assessment. Pt denies SI, HI and AVH at this time. Staff will monitor for pt's safety.
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
## 2021-02-11 ENCOUNTER — Encounter (HOSPITAL_COMMUNITY): Payer: Self-pay | Admitting: Psychiatry

## 2021-02-11 ENCOUNTER — Inpatient Hospital Stay (HOSPITAL_COMMUNITY)
Admission: AD | Admit: 2021-02-11 | Discharge: 2021-02-15 | DRG: 885 | Disposition: A | Discharge status: Home / Self Care | Payer: 59 | Source: Intra-hospital | Attending: Psychiatry | Admitting: Psychiatry

## 2021-02-11 DIAGNOSIS — I10 Essential (primary) hypertension: Secondary | ICD-10-CM | POA: Diagnosis present

## 2021-02-11 DIAGNOSIS — F259 Schizoaffective disorder, unspecified: Secondary | ICD-10-CM | POA: Insufficient documentation

## 2021-02-11 DIAGNOSIS — F250 Schizoaffective disorder, bipolar type: Secondary | ICD-10-CM | POA: Diagnosis present

## 2021-02-11 DIAGNOSIS — F1290 Cannabis use, unspecified, uncomplicated: Secondary | ICD-10-CM | POA: Diagnosis present

## 2021-02-11 DIAGNOSIS — Z20822 Contact with and (suspected) exposure to covid-19: Secondary | ICD-10-CM | POA: Diagnosis present

## 2021-02-11 DIAGNOSIS — F17210 Nicotine dependence, cigarettes, uncomplicated: Secondary | ICD-10-CM | POA: Diagnosis present

## 2021-02-11 DIAGNOSIS — R45850 Homicidal ideations: Secondary | ICD-10-CM | POA: Diagnosis present

## 2021-02-11 DIAGNOSIS — Z8249 Family history of ischemic heart disease and other diseases of the circulatory system: Secondary | ICD-10-CM

## 2021-02-11 DIAGNOSIS — Z79899 Other long term (current) drug therapy: Secondary | ICD-10-CM | POA: Diagnosis not present

## 2021-02-11 DIAGNOSIS — F319 Bipolar disorder, unspecified: Secondary | ICD-10-CM | POA: Insufficient documentation

## 2021-02-11 MED ORDER — ZIPRASIDONE MESYLATE 20 MG IM SOLR: 20.0000 mg | INTRAMUSCULAR | Status: DC | PRN

## 2021-02-11 MED ORDER — LORAZEPAM 1 MG PO TABS
1.0000 mg | ORAL_TABLET | ORAL | Status: AC | PRN
  Administered 2021-02-14: 1 mg via ORAL
  Filled 2021-02-11: qty 1

## 2021-02-11 MED ORDER — TRAZODONE HCL 50 MG PO TABS
50.0000 mg | ORAL_TABLET | Freq: Every evening | ORAL | Status: DC | PRN
  Administered 2021-02-11 – 2021-02-14 (×3): 50 mg via ORAL
  Filled 2021-02-11 (×4): qty 1

## 2021-02-11 MED ORDER — NICOTINE 21 MG/24HR TD PT24
21.0000 mg | MEDICATED_PATCH | Freq: Every day | TRANSDERMAL | Status: DC
  Administered 2021-02-11 – 2021-02-14 (×4): 21 mg via TRANSDERMAL
  Filled 2021-02-11 (×7): qty 1

## 2021-02-11 MED ORDER — AMLODIPINE BESYLATE 5 MG PO TABS
5.0000 mg | ORAL_TABLET | Freq: Every day | ORAL | Status: DC
  Administered 2021-02-11 – 2021-02-14 (×4): 5 mg via ORAL
  Filled 2021-02-11 (×6): qty 1

## 2021-02-11 MED ORDER — DIVALPROEX SODIUM ER 500 MG PO TB24
750.0000 mg | ORAL_TABLET | Freq: Two times a day (BID) | ORAL | Status: DC
  Administered 2021-02-11 – 2021-02-15 (×9): 750 mg via ORAL
  Filled 2021-02-11 (×11): qty 1

## 2021-02-11 MED ORDER — ACETAMINOPHEN 325 MG PO TABS
650.0000 mg | ORAL_TABLET | Freq: Four times a day (QID) | ORAL | Status: DC | PRN
  Administered 2021-02-12: 650 mg via ORAL

## 2021-02-11 MED ORDER — MAGNESIUM HYDROXIDE 400 MG/5ML PO SUSP: 30.0000 mL | Freq: Every day | ORAL | Status: DC | PRN

## 2021-02-11 MED ORDER — ALUM & MAG HYDROXIDE-SIMETH 200-200-20 MG/5ML PO SUSP: 30.0000 mL | ORAL | Status: DC | PRN

## 2021-02-11 MED ORDER — TIZANIDINE HCL 2 MG PO TABS
4.0000 mg | ORAL_TABLET | Freq: Four times a day (QID) | ORAL | Status: DC | PRN
  Administered 2021-02-11 – 2021-02-13 (×2): 4 mg via ORAL
  Filled 2021-02-11 (×3): qty 2

## 2021-02-11 MED ORDER — ALBUTEROL SULFATE HFA 108 (90 BASE) MCG/ACT IN AERS
1.0000 | INHALATION_SPRAY | Freq: Four times a day (QID) | RESPIRATORY_TRACT | Status: DC | PRN
  Administered 2021-02-11 – 2021-02-12 (×2): 2 via RESPIRATORY_TRACT
  Administered 2021-02-13: 1 via RESPIRATORY_TRACT
  Administered 2021-02-13 – 2021-02-15 (×5): 2 via RESPIRATORY_TRACT
  Filled 2021-02-11: qty 6.7

## 2021-02-11 MED ORDER — OLANZAPINE 10 MG PO TBDP: 10.0000 mg | ORAL_TABLET | Freq: Three times a day (TID) | ORAL | Status: DC | PRN

## 2021-02-11 MED ORDER — RISPERIDONE 2 MG PO TBDP
2.0000 mg | ORAL_TABLET | Freq: Two times a day (BID) | ORAL | Status: DC
  Administered 2021-02-11 – 2021-02-15 (×9): 2 mg via ORAL
  Filled 2021-02-11 (×13): qty 1

## 2021-02-11 MED ORDER — HYDROXYZINE HCL 25 MG PO TABS
25.0000 mg | ORAL_TABLET | Freq: Three times a day (TID) | ORAL | Status: DC | PRN
  Administered 2021-02-11 – 2021-02-14 (×3): 25 mg via ORAL
  Filled 2021-02-11 (×4): qty 1

## 2021-02-11 NOTE — Progress Notes (Signed)
D:  Patient's self inventory sheet, patient sleeps good, no sleep medication.  Good appetite, normal energy level, good concentration.  Denied withdrawals.  Denied physical problems.  Physical pain.  Wants to be discharged. A:  Medications administered per MD orders.  Emotional support and encouragement given patient. R:  Denied SI and HI, contracts for safety.  Denied A/V hallucinations.  Safety maintained with 15 minute checks.

## 2021-02-11 NOTE — Progress Notes (Signed)
[facility] Group Notes:  (Nursing/MHT/Case Management/Adjunct)  Date:  02/11/2021  Time:  2015 Type of Therapy:  wrap up group  Participation Level:  Active  Participation Quality:  Appropriate, Attentive, Sharing and Supportive  Affect:  Appropriate  Cognitive:  Alert  Insight:  Improving  Engagement in Group:  Engaged  Modes of Intervention:  Clarification, Education and Support  Summary of Progress/Problems: Positive thinking and positive change were discussed.   Marcille Buffy 02/11/2021, 9:49 PM

## 2021-02-11 NOTE — Tx Team (Signed)
Initial Treatment Plan 02/11/2021 5:37 AM [name] OVZ:[id number]    PATIENT STRESSORS: Marital or family conflict   PATIENT STRENGTHS: Physical Health Supportive family/friends   PATIENT IDENTIFIED PROBLEMS: (Patient did not identify any goals at this time)                     DISCHARGE CRITERIA:  Improved stabilization in mood, thinking, and/or behavior Verbal commitment to aftercare and medication compliance  PRELIMINARY DISCHARGE PLAN: Outpatient therapy Return to previous living arrangement  PATIENT/FAMILY INVOLVEMENT: This treatment plan has been presented to and reviewed with the patient, George Kelley, and/or family member.  The patient and family have been given the opportunity to ask questions and make suggestions.  Mancel Bale, RN 02/11/2021, 5:37 AM

## 2021-02-11 NOTE — [facility] Suicide Risk Assessment (Signed)
[facility] Admission Suicide Risk Assessment   Nursing information obtained from:  Patient Demographic factors:  Male Current Mental Status:  NA Loss Factors:  NA Historical Factors:  NA Risk Reduction Factors:  NA  Total Time spent with patient: 30 minutes Principal Problem: Schizoaffective disorder, bipolar type (HCC) Diagnosis:  Principal Problem:   Schizoaffective disorder, bipolar type (HCC)  Subjective Data: See H&P  Continued Clinical Symptoms:  Alcohol Use Disorder Identification Test Final Score (AUDIT): 5 The "Alcohol Use Disorders Identification Test", Guidelines for Use in Primary Care, Second Edition.  World Science writer [facility]). Score between 0-7:  no or low risk or alcohol related problems. Score between 8-15:  moderate risk of alcohol related problems. Score between 16-19:  high risk of alcohol related problems. Score 20 or above:  warrants further diagnostic evaluation for alcohol dependence and treatment.   CLINICAL FACTORS:  Alcohol/Substance Abuse/Dependencies Schizoaffective disorder bipolar type:   Less than 90 years old, paranoia More than one psychiatric diagnosis Currently Psychotic Unstable or Poor Therapeutic Relationship Previous Psychiatric Diagnoses and Treatments   Musculoskeletal: Strength & Muscle Tone: within normal limits Gait & Station: Asymmetric gait Patient leans: N/A  Psychiatric Specialty Exam:  Presentation  General Appearance: Casual; Other (comment) (Mildly unkempt)  Eye Contact:Fair  Speech:Pressured  Speech Volume:Increased  Handedness:Right   Mood and Affect  Mood:Labile; Irritable; Anxious  Affect:Labile   Thought Process  Thought Processes:Disorganized  Descriptions of Associations:Tangential  Orientation:Full (Time, Place and Person)  Thought Content:Illogical; Paranoid Ideation; Rumination  History of Schizophrenia/Schizoaffective disorder:Yes  Duration of Psychotic Symptoms:Greater than six  months  Hallucinations:Hallucinations: Auditory  Ideas of Reference:Paranoia  Suicidal Thoughts:Suicidal Thoughts: No  Homicidal Thoughts:Homicidal Thoughts: No   Sensorium  Memory:Immediate Fair; Recent Fair; Remote Fair  Judgment:Impaired  Insight:Poor   Executive Functions  Concentration:Fair  Attention Span:Fair  Recall:Fair  Fund of Knowledge:Fair  Language:Good   Psychomotor Activity  Psychomotor Activity:Psychomotor Activity: Increased; Other (comment) (Asymmetric gait)   Assets  Assets:Communication Skills; Desire for Improvement; Financial Resources/Insurance; Housing; Resilience; Social Support   Sleep  Sleep:Sleep: Fair    Physical Exam: Physical Exam  Vitals and nursing note reviewed.  HENT:     Head: Normocephalic and atraumatic.  Neurological:     General: No focal deficit present.     Mental Status: He is alert and oriented to person, place, and time.   ROS  Constitutional: Negative for fever.  HENT: Negative for hearing loss.   Eyes: Negative for blurred vision.  Respiratory: Negative for cough and shortness of breath.   Cardiovascular: Negative for chest pain and palpitations.  Gastrointestinal: Negative for nausea and vomiting.  Genitourinary: Negative for dysuria.  Musculoskeletal: Negative for myalgias.  Skin: Negative for rash.  Neurological: Negative for dizziness, tremors and headaches.  Psychiatric/Behavioral: Positive for hallucinations. Negative for suicidal ideas. The patient is nervous/anxious and has insomnia.   Blood pressure (!) 128/101, pulse 100, temperature 98.1 F (36.7 C), temperature source Oral, resp. rate 18, height 6\' 5"  (1.956 m), weight 104.3 kg, SpO2 99 %. Body mass index is 27.27 kg/m.   COGNITIVE FEATURES THAT CONTRIBUTE TO RISK:  Thought constriction (tunnel vision)    SUICIDE RISK:   Mild:  Suicidal ideation of limited frequency, intensity, duration, and specificity.  There are no identifiable  plans, no associated intent, mild dysphoria and related symptoms, good self-control (both objective and subjective assessment), few other risk factors, and identifiable protective factors, including available and accessible social support.  PLAN OF CARE: See H&P  I certify  that inpatient services furnished can reasonably be expected to improve the patient's condition.   Claudie Revering, MD 02/11/2021, 1:39 PM

## 2021-02-11 NOTE — ED Notes (Signed)
[employer] arrived to transport pt to [facility]. Pt A&O x4, ambulatory. Pt agitated that he must ride with law enforcement but remained cooperative. No signs of acute distress noted. Pt escorted to [facility] without issue. Belongings given to officer from locker #29 to transport with pt. Safety maintained.

## 2021-02-11 NOTE — Discharge Instructions (Addendum)
Transfer to [facility] 

## 2021-02-11 NOTE — ED Notes (Signed)
Patient sleeping

## 2021-02-11 NOTE — [facility] Group Notes (Signed)
Type of Therapy and Topic:  Group Therapy - Healthy vs Unhealthy Coping Skills  Participation Level:  Active   Description of Group The focus of this group was to determine what unhealthy coping techniques typically are used by group members and what healthy coping techniques would be helpful in coping with various problems. Patients were guided in becoming aware of the differences between healthy and unhealthy coping techniques. Patients were asked to identify 2-3 healthy coping skills they would like to learn to use more effectively.  Therapeutic Goals 1. Patients learned that coping is what human beings do all day long to deal with various situations in their lives 2. Patients defined and discussed healthy vs unhealthy coping techniques 3. Patients identified their preferred coping techniques and identified whether these were healthy or unhealthy 4. Patients determined 2-3 healthy coping skills they would like to become more familiar with and use more often. 5. Patients provided support and ideas to each other   Summary of Patient Progress:  During group Bard spent time engaging with his peers and discussing various coping skills that he could use outside of the hospital.  One coping skills that was discussed amongst the group was spending time with friends and loved ones.  Another major theme of the group was various physical activities such as playing basketball and picking flowers. Nox was appropriate and participated in all group activities.    Therapeutic Modalities Cognitive Behavioral Therapy Motivational Interviewing

## 2021-02-11 NOTE — ED Notes (Addendum)
Attempted to call report to [facility] RN completing an admission at this time and requests call back shortly.
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
## 2021-02-11 NOTE — ED Notes (Signed)
Pt asleep in bed. Respirations even and unlabored. Will continue to monitor for safety. ?

## 2021-02-11 NOTE — ED Notes (Signed)
Report called to [place], [facility] RN
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
## 2021-02-11 NOTE — ED Notes (Signed)
Patient sleeping. RR even and unlabored.

## 2021-02-11 NOTE — Tx Team (Signed)
Interdisciplinary Treatment and Diagnostic Plan Update  02/11/2021 Time of Session: 9:30am  George Kelley MRN: [id number]  Principal Diagnosis: <principal problem not specified>  Secondary Diagnoses: Active Problems:   Bipolar disorder ([place])   Schizoaffective disorder ([place])   Current Medications:  Current Facility-Administered Medications  Medication Dose Route Frequency Provider Last Rate Last Admin  . acetaminophen (TYLENOL) tablet 650 mg  650 mg Oral Q6H PRN Lindon Romp A, NP      . albuterol (VENTOLIN HFA) 108 (90 Base) MCG/ACT inhaler 1-2 puff  1-2 puff Inhalation Q6H PRN Lindon Romp A, NP      . alum & mag hydroxide-simeth (MAALOX/MYLANTA) 200-200-20 MG/5ML suspension 30 mL  30 mL Oral Q4H PRN Lindon Romp A, NP      . amLODipine (NORVASC) tablet 5 mg  5 mg Oral Daily Lindon Romp A, NP   5 mg at 02/11/21 0900  . divalproex (DEPAKOTE ER) 24 hr tablet 750 mg  750 mg Oral BID Lindon Romp A, NP   750 mg at 02/11/21 0900  . hydrOXYzine (ATARAX/VISTARIL) tablet 25 mg  25 mg Oral TID PRN Lindon Romp A, NP      . OLANZapine zydis (ZYPREXA) disintegrating tablet 10 mg  10 mg Oral Q8H PRN Rozetta Nunnery, NP       And  . LORazepam (ATIVAN) tablet 1 mg  1 mg Oral PRN Lindon Romp A, NP       And  . ziprasidone (GEODON) injection 20 mg  20 mg Intramuscular PRN Lindon Romp A, NP      . magnesium hydroxide (MILK OF MAGNESIA) suspension 30 mL  30 mL Oral Daily PRN Lindon Romp A, NP      . nicotine (NICODERM CQ - dosed in mg/24 hours) patch 21 mg  21 mg Transdermal Q0600 Lindon Romp A, NP   21 mg at 02/11/21 0900  . risperiDONE (RISPERDAL M-TABS) disintegrating tablet 2 mg  2 mg Oral BID Lindon Romp A, NP   2 mg at 02/11/21 0900  . tiZANidine (ZANAFLEX) tablet 4 mg  4 mg Oral Q6H PRN Lindon Romp A, NP      . traZODone (DESYREL) tablet 50 mg  50 mg Oral QHS PRN Rozetta Nunnery, NP       PTA Medications: Medications Prior to Admission  Medication Sig Dispense Refill Last Dose  .  albuterol (PROVENTIL HFA;VENTOLIN HFA) 108 (90 Base) MCG/ACT inhaler Inhale 1-2 puffs into the lungs every 6 (six) hours as needed for wheezing or shortness of breath. 1 Inhaler 0 Past Month at Unknown time  . divalproex (DEPAKOTE ER) 500 MG 24 hr tablet Take 3 tablets (1,500 mg total) by mouth every evening. (Patient taking differently: Take 500 mg by mouth every evening.) 90 tablet 0 Past Month at Unknown time  . QUEtiapine (SEROQUEL) 300 MG tablet Take 300 mg by mouth at bedtime.   Past Month at Unknown time  . traZODone (DESYREL) 50 MG tablet Take 50 mg by mouth at bedtime.   Past Month at Unknown time  . amLODipine (NORVASC) 5 MG tablet Take 1 tablet (5 mg total) by mouth daily. 30 tablet 0     Patient Stressors: Marital or family conflict  Patient Strengths: Physical Health Supportive family/friends  Treatment Modalities: Medication Management, Group therapy, Case management,  1 to 1 session with clinician, Psychoeducation, Recreational therapy.   Physician Treatment Plan for Primary Diagnosis: <principal problem not specified> Long Term Goal(s):     Short Term Goals:  Medication Management: Evaluate patient's response, side effects, and tolerance of medication regimen.  Therapeutic Interventions: 1 to 1 sessions, Unit Group sessions and Medication administration.  Evaluation of Outcomes: Not Met  Physician Treatment Plan for Secondary Diagnosis: Active Problems:   Bipolar disorder ([place])   Schizoaffective disorder ([place])  Long Term Goal(s):     Short Term Goals:       Medication Management: Evaluate patient's response, side effects, and tolerance of medication regimen.  Therapeutic Interventions: 1 to 1 sessions, Unit Group sessions and Medication administration.  Evaluation of Outcomes: Not Met   RN Treatment Plan for Primary Diagnosis: <principal problem not specified> Long Term Goal(s): Knowledge of disease and therapeutic regimen to maintain health will  improve  Short Term Goals: Ability to remain free from injury will improve, Ability to verbalize frustration and anger appropriately will improve, Ability to participate in decision making will improve, Ability to verbalize feelings will improve, Ability to disclose and discuss suicidal ideas and Ability to identify and develop effective coping behaviors will improve  Medication Management: RN will administer medications as ordered by provider, will assess and evaluate patient's response and provide education to patient for prescribed medication. RN will report any adverse and/or side effects to prescribing provider.  Therapeutic Interventions: 1 on 1 counseling sessions, Psychoeducation, Medication administration, Evaluate responses to treatment, Monitor vital signs and CBGs as ordered, Perform/monitor CIWA, COWS, AIMS and Fall Risk screenings as ordered, Perform wound care treatments as ordered.  Evaluation of Outcomes: Not Met   LCSW Treatment Plan for Primary Diagnosis: <principal problem not specified> Long Term Goal(s): Safe transition to appropriate next level of care at discharge, Engage patient in therapeutic group addressing interpersonal concerns.  Short Term Goals: Engage patient in aftercare planning with referrals and resources, Increase social support, Increase emotional regulation, Facilitate acceptance of mental health diagnosis and concerns, Identify triggers associated with mental health/substance abuse issues and Increase skills for wellness and recovery  Therapeutic Interventions: Assess for all discharge needs, 1 to 1 time with Social worker, Explore available resources and support systems, Assess for adequacy in community support network, Educate family and significant other(s) on suicide prevention, Complete Psychosocial Assessment, Interpersonal group therapy.  Evaluation of Outcomes: Not Met   Progress in Treatment: Attending groups: No. Participating in groups:  No. Taking medication as prescribed: Yes. Toleration medication: Yes. Family/Significant other contact made: No, will contact:  If consents are provided  Patient understands diagnosis: No. Discussing patient identified problems/goals with staff: Yes. Medical problems stabilized or resolved: Yes. Denies suicidal/homicidal ideation: Yes. Issues/concerns per patient self-inventory: No.   New problem(s) identified: No, Describe:  None   New Short Term/Long Term Goal(s): medication stabilization, elimination of SI thoughts, development of comprehensive mental wellness plan.   Patient Goals:  "To go home"  Discharge Plan or Barriers: Patient recently admitted. CSW will continue to follow and assess for appropriate referrals and possible discharge planning.   Reason for Continuation of Hospitalization: Aggression Mania Medication stabilization  Estimated Length of Stay: 3 to 5 days   Attendees: Patient: George Kelley  02/11/2021   Physician: Melba Coon, MD 02/11/2021   Nursing:  02/11/2021   RN Care Manager: 02/11/2021   Social Worker: Verdis Frederickson, [place] 02/11/2021   Recreational Therapist:  02/11/2021   Other:  02/11/2021   Other:  02/11/2021   Other: 02/11/2021     Scribe for Treatment Team: Darleen Crocker, LCSWA 02/11/2021 11:20 AM

## 2021-02-11 NOTE — ED Notes (Signed)
[facility] called for transport 

## 2021-02-11 NOTE — Plan of Care (Signed)
Nurse discussed anxiety, depression and coping skills with patient.  

## 2021-02-11 NOTE — Progress Notes (Signed)
Recreation Therapy Notes  Date: 4.20.22 Time: 0930 Location: [address] Dayroom  Group Topic: Stress Management   Goal Area(s) Addresses:  Patient will actively participate in stress management techniques presented during session.  Patient will successfully identify benefit of practicing stress management post d/c.   Behavioral Response: Appropriate  Intervention: Guided exercise with ambient sound and script  Activity :Guided Imagery  LRT read a script that encouraged patients to envision being in their peaceful place.  Patients were to imagine the sights, sounds and smells of the place they are envisioning.    Education:  Stress Management, Discharge Planning.   Education Outcome: Acknowledges education  Clinical Observations/Feedback: Patient attended and participated in group session.   Jannah Guardiola, LRT/CTRS         Ramandeep Arington A 02/11/2021 11:34 AM 

## 2021-02-11 NOTE — H&P (Signed)
Psychiatric Admission Assessment Adult  Patient Identification: George Kelley MRN:  [id number] Date of Evaluation:  02/11/2021 Chief Complaint:  Bipolar disorder ([place]) [F31.9] Schizoaffective disorder ([place]) [F25.9] Principal Diagnosis: Schizoaffective disorder, bipolar type ([place]) Diagnosis:  Principal Problem:   Schizoaffective disorder, bipolar type ([place])  History of Present Illness: Medical record reviewed.  Patient's case was discussed in detail today with members of the treatment team.  I met with and interviewed the patient this morning on the unit.  Patient is a poor historian due to disorganized thought processes. George Kelley 40 year old male with a history of schizoaffective disorder who presented on IVC brought in by law enforcement to behavioral health urgent care center for homicidal ideation towards police officer and towards patient's neighbors.  In the [facility], patient made statements that his neighbor was calling police and pretending to be his wife.  Patient went to the police department to complain about his neighbor and ended up being restrained by police and transported to behavioral health urgent care.  In the [facility], the patient reported symptoms of auditory hallucinations.  He was admitted from [facility] to the [facility].  On interview with me today, the patient is cooperative and polite but displays hyperverbal pressured speech, mild motor agitation, disorganized and tangential thought processes, anxious and irritable mood, labile affect and requires frequent redirection. The patient states that he is in the hospital because "I need the ACT team."  Patient reports hearing the voice of his neighbor making comments about him such as, "He ain't dead.  He ain't locked up.  He need to be dead."  Patient states that he believes his neighbor is jealous of him.  Patient reports he hears his neighbor making these comments constantly whether patient is outside or inside his home and reports this has  been happening for the past 2 years.  He denies current wish for death or suicidal ideation.  He denies current AI or HI.  Patient rambles about a variety of topics and requires frequent redirection during the interview.  He is primarily focused on different conflicts that he has with his neighbors or different issues that he has experienced with the police over the years and the information he recounts is often difficult to follow.  He reports daily marijuana use of 2-3 blunts per day but denies other substance use.  The patient denies sad or depressed mood or anhedonia.  He reports that he has been sleeping about 4 to 5 hours per night and that his appetite is okay.  He states that he has been taking his psychiatric medications consistently which he reports to include Depakote 500 mg every morning, Seroquel 500 mg q. noon and trazodone at an unknown dose at bedtime.  He gets his medications at the [facility] [place] [address] in [place].  Patient reports that he has access to a firearm and has papers for the gun at his home which he uses for protection.   Associated Signs/Symptoms: Depression Symptoms:  insomnia, psychomotor agitation, anxiety, Duration of Depression Symptoms: No data recorded (Hypo) Manic Symptoms:  Delusions, Distractibility, Flight of Ideas, Hallucinations, Impulsivity, Irritable Mood, Mood lability Anxiety Symptoms:  Anxiety about conflict with neighbors and conflict with police Psychotic Symptoms:  Delusions, Hallucinations: Auditory Paranoia, PTSD Symptoms: NA Total Time spent with patient: 30 minutes  Past Psychiatric History: Patient states that this is his second inpatient admission.  Chart review reveals an admission to [facility] in 2017 for agitation and anger toward clinic staff and his neighbor.  He was  discharged from that admission on Depakote 1500 mg at bedtime and quetiapine 600 mg at bedtime.  He reports that he currently has no psychiatrist.  He was previously  diagnosed with schizoaffective disorder, bipolar type.  He denies any history of suicide attempts.  The patient denies any history of nonsuicidal self-injurious behavior.  Is the patient at risk to self? Yes.    Has the patient been a risk to self in the past 6 months? Yes.    Has the patient been a risk to self within the distant past? No.  Is the patient a risk to others? Yes.    Has the patient been a risk to others in the past 6 months? Yes.    Has the patient been a risk to others within the distant past? Yes.     Prior Inpatient Therapy:   Prior Outpatient Therapy:    Alcohol Screening: 1. How often do you have a drink containing alcohol?: 2 to 4 times a month 2. How many drinks containing alcohol do you have on a typical day when you are drinking?: 1 or 2 3. How often do you have six or more drinks on one occasion?: Weekly AUDIT-C Score: 5 9. Have you or someone else been injured as a result of your drinking?: No 10. Has a relative or friend or a doctor or another health worker been concerned about your drinking or suggested you cut down?: No Alcohol Use Disorder Identification Test Final Score (AUDIT): 5 Substance Abuse History in the last 12 months:  Yes.   Consequences of Substance Abuse: Patient reports a history of daily marijuana use of 2-3 blunts per day.  He reports a history of heavy cigarette smoking.  Patient states he smokes 8 packs/day.  He denies other drug use.  He reports occasional alcohol use. Previous Psychotropic Medications: Yes  Psychological Evaluations: Yes  Past Medical History:  Past Medical History:  Diagnosis Date  . Asthma   . Bipolar affective ([place])   . Hypertension   . Schizophrenia [facility])     Past Surgical History:  Procedure Laterality Date  . head injury    . Left knee     Family History:  Family History  Problem Relation Age of Onset  . Cancer Mother   . CAD Mother    Family Psychiatric  History: Patient denies any family history of  suicide.  He reports that he has a maternal uncle with mental health issues.  He reports that 2 of his maternal great uncles drank a lot of alcohol. Tobacco Screening: Have you used any form of tobacco in the last 30 days? (Cigarettes, Smokeless Tobacco, Cigars, and/or Pipes): Yes Tobacco use, Select all that apply: 5 or more cigarettes per day Are you interested in Tobacco Cessation Medications?: No, patient refused Counseled patient on smoking cessation including recognizing danger situations, developing coping skills and basic information about quitting provided: Refused/Declined practical counseling Social History:  Social History   Substance and Sexual Activity  Alcohol Use Not Currently     Social History   Substance and Sexual Activity  Drug Use Not Currently  . Types: Marijuana    Additional Social History:                           Allergies:   Allergies  Allergen Reactions  . Pork-Derived Products     Pt states he doesn't Eat pork at all due to religious preference   .  Tomato Swelling   Lab Results:  Results for orders placed or performed during the hospital encounter of 02/10/21 (from the past 48 hour(s))  POCT Urine Drug Screen - ([facility])     Status: Normal   Collection Time: 02/10/21  3:22 PM  Result Value Ref Range   POC Amphetamine UR None Detected NONE DETECTED (Cut Off Level 1000 ng/mL)   POC Secobarbital (BAR) None Detected NONE DETECTED (Cut Off Level 300 ng/mL)   POC Buprenorphine (BUP) None Detected NONE DETECTED (Cut Off Level 10 ng/mL)   POC Oxazepam (BZO) None Detected NONE DETECTED (Cut Off Level 300 ng/mL)   POC Cocaine UR None Detected NONE DETECTED (Cut Off Level 300 ng/mL)   POC Methamphetamine UR None Detected NONE DETECTED (Cut Off Level 1000 ng/mL)   POC Morphine None Detected NONE DETECTED (Cut Off Level 300 ng/mL)   POC Oxycodone UR None Detected NONE DETECTED (Cut Off Level 100 ng/mL)   POC Methadone UR None Detected NONE DETECTED  (Cut Off Level 300 ng/mL)   POC Marijuana UR None Detected NONE DETECTED (Cut Off Level 50 ng/mL)  TSH     Status: None   Collection Time: 02/10/21  3:23 PM  Result Value Ref Range   TSH 2.005 0.350 - 4.500 uIU/mL    Comment: Performed by a 3rd Generation assay with a functional sensitivity of <=0.01 uIU/mL. Performed at [facility], [place] [address] [place], [place] [address]   Hemoglobin A1c     Status: Abnormal   Collection Time: 02/10/21  3:23 PM  Result Value Ref Range   Hgb A1c MFr Bld 5.9 (H) 4.8 - 5.6 %    Comment: (NOTE) Pre diabetes:          5.7%-6.4%  Diabetes:              >6.4%  Glycemic control for   <7.0% adults with diabetes    Mean Plasma Glucose 122.63 mg/dL    Comment: Performed at [facility] [address], [place], [place] [address]  Comprehensive metabolic panel     Status: None   Collection Time: 02/10/21  3:23 PM  Result Value Ref Range   Sodium 135 135 - 145 mmol/L   Potassium 5.1 3.5 - 5.1 mmol/L   Chloride 100 98 - 111 mmol/L   CO2 26 22 - 32 mmol/L   Glucose, Bld 95 70 - 99 mg/dL    Comment: Glucose reference range applies only to samples taken after fasting for at least 8 hours.   BUN 16 6 - 20 mg/dL   Creatinine, Ser 1.21 0.61 - 1.24 mg/dL   Calcium 9.8 8.9 - 10.3 mg/dL   Total Protein 7.3 6.5 - 8.1 g/dL   Albumin 4.5 3.5 - 5.0 g/dL   AST 27 15 - 41 U/L   ALT 29 0 - 44 U/L   Alkaline Phosphatase 70 38 - 126 U/L   Total Bilirubin 0.7 0.3 - 1.2 mg/dL   GFR, Estimated >60 >60 mL/min    Comment: (NOTE) Calculated using the CKD-EPI Creatinine Equation (2021)    Anion gap 9 5 - 15    Comment: Performed at [facility] [address], [place], [place] [address]  CBC with Differential/Platelet     Status: Abnormal   Collection Time: 02/10/21  3:23 PM  Result Value Ref Range   WBC 8.5 4.0 - 10.5 K/uL   RBC 4.86 4.22 - 5.81 MIL/uL   Hemoglobin 16.9 13.0 -  17.0 g/dL   HCT 51.4 39.0 - 52.0 %   MCV 105.8 (H) 80.0 - 100.0 fL    MCH 34.8 (H) 26.0 - 34.0 pg   MCHC 32.9 30.0 - 36.0 g/dL   RDW 11.9 11.5 - 15.5 %   Platelets 245 150 - 400 K/uL   nRBC 0.0 0.0 - 0.2 %   Neutrophils Relative % 63 %   Neutro Abs 5.4 1.7 - 7.7 K/uL   Lymphocytes Relative 28 %   Lymphs Abs 2.4 0.7 - 4.0 K/uL   Monocytes Relative 8 %   Monocytes Absolute 0.7 0.1 - 1.0 K/uL   Eosinophils Relative 0 %   Eosinophils Absolute 0.0 0.0 - 0.5 K/uL   Basophils Relative 1 %   Basophils Absolute 0.1 0.0 - 0.1 K/uL   Immature Granulocytes 0 %   Abs Immature Granulocytes 0.03 0.00 - 0.07 K/uL    Comment: Performed at [facility] [address] [place], [place] [address]  Valproic acid level     Status: Abnormal   Collection Time: 02/10/21  3:23 PM  Result Value Ref Range   Valproic Acid Lvl <10 (L) 50.0 - 100.0 ug/mL    Comment: RESULTS CONFIRMED BY MANUAL DILUTION Performed at [facility] [address] [place], [place] [address]   Resp Panel by RT-PCR (Flu A&B, Covid) Nasopharyngeal Swab     Status: None   Collection Time: 02/10/21  3:25 PM   Specimen: Nasopharyngeal Swab; Nasopharyngeal(NP) swabs in vial transport medium  Result Value Ref Range   SARS Coronavirus 2 by RT PCR NEGATIVE NEGATIVE    Comment: (NOTE) SARS-CoV-2 target nucleic acids are NOT DETECTED.  The SARS-CoV-2 RNA is generally detectable in upper respiratory specimens during the acute phase of infection. The lowest concentration of SARS-CoV-2 viral copies this assay can detect is 138 copies/mL. A negative result does not preclude SARS-Cov-2 infection and should not be used as the sole basis for treatment or other patient management decisions. A negative result may occur with  improper specimen collection/handling, submission of specimen other than nasopharyngeal swab, presence of viral mutation(s) within the areas targeted by this assay, and inadequate number of viral copies(<138 copies/mL). A negative result must be combined with clinical  observations, patient history, and epidemiological information. The expected result is Negative.  Fact Sheet for Patients:  EntrepreneurPulse.com.au  Fact Sheet for Healthcare Providers:  IncredibleEmployment.be  This test is no t yet approved or cleared by the [place] FDA and  has been authorized for detection and/or diagnosis of SARS-CoV-2 by FDA under an Emergency Use Authorization (EUA). This EUA will remain  in effect (meaning this test can be used) for the duration of the COVID-19 declaration under Section 564(b)(1) of the Act, 21 U.S.C.section 360bbb-3(b)(1), unless the authorization is terminated  or revoked sooner.       Influenza A by PCR NEGATIVE NEGATIVE   Influenza B by PCR NEGATIVE NEGATIVE    Comment: (NOTE) The Xpert Xpress SARS-CoV-2/FLU/RSV plus assay is intended as an aid in the diagnosis of influenza from Nasopharyngeal swab specimens and should not be used as a sole basis for treatment. Nasal washings and aspirates are unacceptable for Xpert Xpress SARS-CoV-2/FLU/RSV testing.  Fact Sheet for Patients: EntrepreneurPulse.com.au  Fact Sheet for Healthcare Providers: IncredibleEmployment.be  This test is not yet approved or cleared by the [place] FDA and has been authorized for detection and/or diagnosis of SARS-CoV-2 by FDA under an Emergency Use Authorization (EUA). This EUA  will remain in effect (meaning this test can be used) for the duration of the COVID-19 declaration under Section 564(b)(1) of the Act, 21 U.S.C. section 360bbb-3(b)(1), unless the authorization is terminated or revoked.  Performed at [facility], [place] [address] [place], [place] [address]   POC SARS Coronavirus 2 Ag     Status: None   Collection Time: 02/10/21  3:28 PM  Result Value Ref Range   SARS Coronavirus 2 Ag NEGATIVE NEGATIVE    Comment: (NOTE) SARS-CoV-2 antigen NOT DETECTED.    Negative results are presumptive.  Negative results do not preclude SARS-CoV-2 infection and should not be used as the sole basis for treatment or other patient management decisions, including infection  control decisions, particularly in the presence of clinical signs and  symptoms consistent with COVID-19, or in those who have been in contact with the virus.  Negative results must be combined with clinical observations, patient history, and epidemiological information. The expected result is Negative.  Fact Sheet for Patients: HandmadeRecipes.com.cy  Fact Sheet for Healthcare Providers: FuneralLife.at  This test is not yet approved or cleared by the [place] FDA and  has been authorized for detection and/or diagnosis of SARS-CoV-2 by FDA under an Emergency Use Authorization (EUA).  This EUA will remain in effect (meaning this test can be used) for the duration of  the COV ID-19 declaration under Section 564(b)(1) of the Act, 21 U.S.C. section 360bbb-3(b)(1), unless the authorization is terminated or revoked sooner.    POC SARS Coronavirus 2 Ag     Status: None   Collection Time: 02/10/21  5:43 PM  Result Value Ref Range   SARS Coronavirus 2 Ag NEGATIVE NEGATIVE    Comment: (NOTE) SARS-CoV-2 antigen NOT DETECTED.   Negative results are presumptive.  Negative results do not preclude SARS-CoV-2 infection and should not be used as the sole basis for treatment or other patient management decisions, including infection  control decisions, particularly in the presence of clinical signs and  symptoms consistent with COVID-19, or in those who have been in contact with the virus.  Negative results must be combined with clinical observations, patient history, and epidemiological information. The expected result is Negative.  Fact Sheet for Patients: HandmadeRecipes.com.cy  Fact Sheet for Healthcare  Providers: FuneralLife.at  This test is not yet approved or cleared by the [place] FDA and  has been authorized for detection and/or diagnosis of SARS-CoV-2 by FDA under an Emergency Use Authorization (EUA).  This EUA will remain in effect (meaning this test can be used) for the duration of  the COV ID-19 declaration under Section 564(b)(1) of the Act, 21 U.S.C. section 360bbb-3(b)(1), unless the authorization is terminated or revoked sooner.      Blood Alcohol level:  Lab Results  Component Value Date   ETH <10 09/06/2019   ETH <10 98/26/4158    Metabolic Disorder Labs:  Lab Results  Component Value Date   HGBA1C 5.9 (H) 02/10/2021   MPG 122.63 02/10/2021   MPG 120 03/12/2016   Lab Results  Component Value Date   PROLACTIN 24.1 (H) 03/12/2016   Lab Results  Component Value Date   CHOL 148 03/12/2016   TRIG 206 (H) 03/12/2016   HDL 31 (L) 03/12/2016   CHOLHDL 4.8 03/12/2016   VLDL 41 (H) 03/12/2016   LDLCALC 76 03/12/2016    Current Medications: Current Facility-Administered Medications  Medication Dose Route Frequency Provider Last Rate Last Admin  . acetaminophen (TYLENOL) tablet 650 mg  650 mg Oral Q6H PRN Lindon Romp A, NP      . albuterol (VENTOLIN HFA) 108 (90 Base) MCG/ACT inhaler 1-2 puff  1-2 puff Inhalation Q6H PRN Lindon Romp A, NP      . alum & mag hydroxide-simeth (MAALOX/MYLANTA) 200-200-20 MG/5ML suspension 30 mL  30 mL Oral Q4H PRN Lindon Romp A, NP      . amLODipine (NORVASC) tablet 5 mg  5 mg Oral Daily Lindon Romp A, NP   5 mg at 02/11/21 0900  . divalproex (DEPAKOTE ER) 24 hr tablet 750 mg  750 mg Oral BID Lindon Romp A, NP   750 mg at 02/11/21 0900  . hydrOXYzine (ATARAX/VISTARIL) tablet 25 mg  25 mg Oral TID PRN Lindon Romp A, NP      . OLANZapine zydis (ZYPREXA) disintegrating tablet 10 mg  10 mg Oral Q8H PRN Rozetta Nunnery, NP       And  . LORazepam (ATIVAN) tablet 1 mg  1 mg Oral PRN Lindon Romp A,  NP       And  . ziprasidone (GEODON) injection 20 mg  20 mg Intramuscular PRN Lindon Romp A, NP      . magnesium hydroxide (MILK OF MAGNESIA) suspension 30 mL  30 mL Oral Daily PRN Lindon Romp A, NP      . nicotine (NICODERM CQ - dosed in mg/24 hours) patch 21 mg  21 mg Transdermal Q0600 Lindon Romp A, NP   21 mg at 02/11/21 0900  . risperiDONE (RISPERDAL M-TABS) disintegrating tablet 2 mg  2 mg Oral BID Lindon Romp A, NP   2 mg at 02/11/21 0900  . tiZANidine (ZANAFLEX) tablet 4 mg  4 mg Oral Q6H PRN Lindon Romp A, NP      . traZODone (DESYREL) tablet 50 mg  50 mg Oral QHS PRN Rozetta Nunnery, NP       PTA Medications: Medications Prior to Admission  Medication Sig Dispense Refill Last Dose  . albuterol (PROVENTIL HFA;VENTOLIN HFA) 108 (90 Base) MCG/ACT inhaler Inhale 1-2 puffs into the lungs every 6 (six) hours as needed for wheezing or shortness of breath. 1 Inhaler 0 Past Month at Unknown time  . divalproex (DEPAKOTE ER) 500 MG 24 hr tablet Take 3 tablets (1,500 mg total) by mouth every evening. (Patient taking differently: Take 500 mg by mouth every evening.) 90 tablet 0 Past Month at Unknown time  . QUEtiapine (SEROQUEL) 300 MG tablet Take 300 mg by mouth at bedtime.   Past Month at Unknown time  . traZODone (DESYREL) 50 MG tablet Take 50 mg by mouth at bedtime.   Past Month at Unknown time  . amLODipine (NORVASC) 5 MG tablet Take 1 tablet (5 mg total) by mouth daily. 30 tablet 0     Musculoskeletal: Strength & Muscle Tone: within normal limits Gait & Station: Asymmetric gait.  Ambulates keeping right leg straight. Patient leans: N/A            Psychiatric Specialty Exam:  Presentation  General Appearance: Casual; Other (comment) (Mildly unkempt)  Eye Contact:Fair  Speech:Pressured  Speech Volume:Increased  Handedness:Right   Mood and Affect  Mood:Labile; Irritable; Anxious  Affect:Labile   Thought Process  Thought Processes:Disorganized  Duration of  Psychotic Symptoms: Greater than six months  Past Diagnosis of Schizophrenia or Psychoactive disorder: Yes  Descriptions of Associations:Tangential  Orientation:Full (Time, Place and Person)  Thought Content:Illogical; Paranoid Ideation; Rumination  Hallucinations:Hallucinations: Auditory  Ideas of Reference:Paranoia  Suicidal Thoughts:Suicidal Thoughts:  No  Homicidal Thoughts:Homicidal Thoughts: No   Sensorium  Memory:Immediate Fair; Recent Fair; Remote Fair  Judgment:Impaired  Insight:Poor   Executive Functions  Concentration:Fair  Attention Span:Fair  [place]  Language:Good   Psychomotor Activity  Psychomotor Activity:Psychomotor Activity: Increased; Other (comment) (Asymmetric gait)   Assets  Assets:Communication Skills; Desire for Improvement; Financial Resources/Insurance; Housing; Resilience; Social Support   Sleep  Sleep:Sleep: Fair    Physical Exam: Physical Exam Vitals and nursing note reviewed.  HENT:     Head: Normocephalic and atraumatic.  Neurological:     General: No focal deficit present.     Mental Status: He is alert and oriented to person, place, and time.    Review of Systems  Constitutional: Negative for fever.  HENT: Negative for hearing loss.   Eyes: Negative for blurred vision.  Respiratory: Negative for cough and shortness of breath.   Cardiovascular: Negative for chest pain and palpitations.  Gastrointestinal: Negative for nausea and vomiting.  Genitourinary: Negative for dysuria.  Musculoskeletal: Negative for myalgias.  Skin: Negative for rash.  Neurological: Negative for dizziness, tremors and headaches.  Psychiatric/Behavioral: Positive for hallucinations. Negative for suicidal ideas. The patient is nervous/anxious and has insomnia.    Blood pressure (!) 128/101, pulse 100, temperature 98.1 F (36.7 C), temperature source Oral, resp. rate 18, height '6\' 5"'  (1.956 m), weight 104.3 kg,  SpO2 99 %. Body mass index is 27.27 kg/m.  Treatment Plan Summary: The patient is a 40 year old male with schizoaffective disorder, bipolar type currently displaying worsening psychotic and manic symptoms as well as thoughts of harming others in the context of incomplete adherence with psychiatric medications and lack of outpatient psychiatric provider.  Patient psychiatric hospitalization is necessary for treatment and stabilization of psychotic and mood symptoms and for safety of patient and others.  Daily contact with patient to assess and evaluate symptoms and progress in treatment and Medication management  Observation Level/Precautions:  15 minute checks  Laboratory:  CBC Chemistry Profile HbAIC UDS Lipid panel, TSH.  Available lab results reviewed.  CMP was WNL.  CBC revealed MCV of 105.8 and MCH of 34.8.  Differential is normal.  TSH is WNL at 2.005.  Hemoglobin A1c is 5.9.  Valproic acid level is less than 10.  Urine drug screen was negative.  Psychotherapy: Patient in group therapy and therapeutic milieu  Medications: Continue Depakote 750 mg twice daily for mood stabilization.  Continue risperidone 2 mg twice daily for psychotic symptoms and adjunctive mood stabilization.  See MAR for additional information.  Consultations:    Discharge Concerns: Patient would benefit from referral to ACT team at time of discharge who can follow him as an outpatient  Estimated LOS: 5-7 days  Other:     Physician Treatment Plan for Primary Diagnosis: Schizoaffective disorder, bipolar type ([place]) Long Term Goal(s): Improvement in symptoms so as ready for discharge  Short Term Goals: Ability to identify changes in lifestyle to reduce recurrence of condition will improve, Ability to verbalize feelings will improve, Ability to demonstrate self-control will improve, Ability to identify and develop effective coping behaviors will improve, Compliance with prescribed medications will improve and Ability to  identify triggers associated with substance abuse/mental health issues will improve   I certify that inpatient services furnished can reasonably be expected to improve the patient's condition.    Arthor Captain, MD 4/20/20221:16 PM

## 2021-02-12 DIAGNOSIS — F250 Schizoaffective disorder, bipolar type: Principal | ICD-10-CM

## 2021-02-12 LAB — CBC WITH DIFFERENTIAL/PLATELET
Abs Immature Granulocytes: 0.01 10*3/uL (ref 0.00–0.07)
Basophils Absolute: 0 10*3/uL (ref 0.0–0.1)
Basophils Relative: 1 %
Eosinophils Absolute: 0.1 10*3/uL (ref 0.0–0.5)
Eosinophils Relative: 1 %
HCT: 49.5 % (ref 39.0–52.0)
Hemoglobin: 16.1 g/dL (ref 13.0–17.0)
Immature Granulocytes: 0 %
Lymphocytes Relative: 55 %
Lymphs Abs: 3.4 10*3/uL (ref 0.7–4.0)
MCH: 34.5 pg — ABNORMAL HIGH (ref 26.0–34.0)
MCHC: 32.5 g/dL (ref 30.0–36.0)
MCV: 106.2 fL — ABNORMAL HIGH (ref 80.0–100.0)
Monocytes Absolute: 0.6 10*3/uL (ref 0.1–1.0)
Monocytes Relative: 9 %
Neutro Abs: 2.1 10*3/uL (ref 1.7–7.7)
Neutrophils Relative %: 34 %
Platelets: 208 10*3/uL (ref 150–400)
RBC: 4.66 MIL/uL (ref 4.22–5.81)
RDW: 11.9 % (ref 11.5–15.5)
WBC: 6.2 10*3/uL (ref 4.0–10.5)
nRBC: 0 % (ref 0.0–0.2)

## 2021-02-12 LAB — LIPID PANEL
Cholesterol: 150 mg/dL (ref 0–200)
HDL: 36 mg/dL — ABNORMAL LOW (ref >40)
LDL Cholesterol: 73 mg/dL (ref 0–99)
Total CHOL/HDL Ratio: 4.2 RATIO
Triglycerides: 204 mg/dL — ABNORMAL HIGH (ref <150)
VLDL: 41 mg/dL — ABNORMAL HIGH (ref 0–40)

## 2021-02-12 LAB — VITAMIN B12: Vitamin B-12: 433 pg/mL (ref 180–914)

## 2021-02-12 NOTE — Plan of Care (Signed)
Nurse discussed anxiety, depression and coping skills with patient.  

## 2021-02-12 NOTE — BHH Counselor (Signed)
Adult Comprehensive Assessment  Patient ID: George Kelley, male   DOB: [birth year], 40 y.o.   MRN: [id number]    Information Source: Information source: Patient  Current Stressors:  Educational / Learning stressors: Pt reports an 11th grade education  Employment / Job issues: Pt reports being on Disability  Family Relationships: Pt reports no stressors  Surveyor, quantity / Lack of resources (include bankruptcy): Pt reports SSDI, Medicaid, [place] [employer] / Lack of housing:Pt reports living with wife and 40 year old Daughter  Physical health (include injuries & life threatening diseases):Pt reports asthma, hypertension, allergies Social relationships: Pt reports few social relationships  Substance abuse: Pt reports smoking Marijuana daily  Bereavement / Loss: Pt reports mother passed in 02-13-2010.   Living/Environment/Situation:  Living Arrangements: Spouse/significant other Living conditions (as described by patient or guardian): Pt reports living with his wife and 40 year old daughter  How long has patient lived in current situation?: 11 years What is atmosphere in current home: Supportive, Chaotic   Family History:  Marital status: Married Number of Years Married: 2 What types of issues is patient dealing with in the relationship?: "we take good care of each other." Additional relationship information: n/a  Are you sexually active?: Yes What is your sexual orientation?: heterosexual Has your sexual activity been affected by drugs, alcohol, medication, or emotional stress?: n/a  Does patient have children?: Yes, 74 year old daughter, Pt reports they have a close relationship.   Childhood History:  By whom was/is the patient raised?: Mother Additional childhood history information: Mother raised him with help of his aunt and cousin. "My dad was around sometimes but my parents weren't married." Pt reports "decent childhood."  Description of patient's relationship with caregiver when they  were a child: close to mother  Patient's description of current relationship with people who raised him/her: mother died in 02/13/2010. fairly close to father who lives in [place], [place] currently.  How were you disciplined when you got in trouble as a child/adolescent?: n/a  Does patient have siblings?: Yes Number of Siblings: 3 Description of patient's current relationship with siblings: "my brothers are in prison and are getting out soon." Not particularly close to his siblings. "I'm the only one who has never really been in trouble."  Did patient suffer any verbal/emotional/physical/sexual abuse as a child?: No (pt denies; however chart indicates some abuse in childhood. ) Did patient suffer from severe childhood neglect?: No Has patient ever been sexually abused/assaulted/raped as an adolescent or adult?: No Was the patient ever a victim of a crime or a disaster?: No Witnessed domestic violence?: No Has patient been effected by domestic violence as an adult?: No Description of domestic violence: n/a   Education:  Highest grade of school patient has completed: Pt reports an 11th grade and some of 12th. pt in special ed and struggled in school/quit prior to graduation.  Currently a student?: No Learning disability?: Yes What learning problems does patient have?: reading comprehension; math   Employment/Work Situation:   Employment situation: On disability Why is patient on disability: Mental Health  How long has patient been on disability: Unknown  Patient's job has been impacted by current illness: No What is the longest time patient has a held a job?: Pt reports no previous employment history  Where was the patient employed at that time?: N/A  Has patient ever been in the [employer]?: No Has patient ever served in combat?: No Did You Receive Any Psychiatric Treatment/Services While in Equities trader?: No Are There  Guns or Other Weapons in Your Home?: No Are These Weapons Safely Secured?:  No Who Could Verify You Are Able To Have These Secured:: n/a   Financial Resources:   Financial resources: [employer], [employer], [employer], Income from spouse Does patient have a Lawyer or guardian?: No  Alcohol/Substance Abuse:   What has been your use of drugs/alcohol within the last 12 months?: Pt reports smoking Marijuana daily  If attempted suicide, did drugs/alcohol play a role in this?: No Alcohol/Substance Abuse Treatment Hx: Denies past history If yes, describe treatment:None  Has alcohol/substance abuse ever caused legal problems?: Yes (previous legal issues but no current legal issues)  Social Support System:   Patient's Community Support System: Poor Describe Community Support System: Pt reports wife and child are supports  Type of faith/religion: Christian How does patient's faith help to cope with current illness?: Prayer   Leisure/Recreation:   Leisure and Hobbies: Football, Exercise, time with pet, basket ball, and video games   Strengths/Needs:   What things does the patient do well?: Basketball, video games, and boxing In what areas does patient struggle / problems for patient: Insight; judgement; coping skills; emotional regualation skills   Discharge Plan:   Does patient have access to transportation?: Yes (wife) Will patient be returning to same living situation after discharge?: Yes (home with wife) Currently receiving community mental health services: No If no, would patient like referral for services when discharged?: Yes (What county?) ([place]) Does patient have financial barriers related to discharge medications?: No  Summary/Recommendations:   Summary and Recommendations (to be completed by the evaluator): George Kelley is a 40 year old, AA, male who was admitted from the hospital due Hallucinations, aggression, and worsening depression.  The Pt is a poor historian and provides varying stories about the events leading up to  admission.  The Pt reports living with his wife and 40 year old daughter.  The Pt reports few social and community supports outside of his immediate family. The Pt reports that he receives disability for his mental health and has never held any form of employment.  The Pt reports that he smokes Marijuana daily but denies all other substance use. While in the hospital the Pt can benefit from crisis stabilization, medication evaluation, group therapy, psycho-education, case management, and discharge planning.  Upon discharge the Pt will return home with his wife and child and will follow up with [facility] for therapy and medication management.   George Kelley. 02/12/2021

## 2021-02-12 NOTE — Progress Notes (Signed)
Pt focused on going home, pt stated he was ready to go, pt did endorse some pain from where the " police roughed me up"    02/12/21 0000  Psych Admission Type (Psych Patients Only)  Admission Status Involuntary  Psychosocial Assessment  Patient Complaints Anxiety  Eye Contact Fair  Facial Expression Anxious  Affect Angry  Speech Logical/coherent  Interaction Minimal;Guarded  Motor Activity Slow;Shuffling  Appearance/Hygiene Unremarkable  Behavior Characteristics Cooperative  Mood Anxious;Depressed  Thought Process  Coherency WDL  Content WDL  Delusions None reported or observed  Perception WDL  Hallucination None reported or observed  Judgment Impaired  Confusion None  Danger to Self  Current suicidal ideation? Denies  Danger to Others  Danger to Others None reported or observed

## 2021-02-12 NOTE — Progress Notes (Signed)
D:   Patient's self inventory sheet, patient slept good last night, no sleep medicine.  Good appetite, normal energy level, good concentration.  Denied withdrawals.  Denied SI.  Denied physical problems.  Physical pain R side.  Goal is discharge today.  Would like to go home today. A:  Medications administered per MD orders.  Emotional support and encouragement given patient. R:  Safety maintained with 15 minute checks.  Denied SI and HI, contracts for safety.  Denied A/V hallucinations.

## 2021-02-12 NOTE — Progress Notes (Signed)
[facility] MD Progress Note  02/12/2021 6:04 PM George Kelley  MRN:  [id number]   Subjective:  "I just really need to go home. I am fine, I don't want to hurt nobody."  On evaluation today, George Kelley is polite, but quite tangential and disorganized in expression of thought. Often looks away as if hearing a voice, saying he is hearing someone call his name, but denies this is a  hallucination. He reports that his wife needs him to come home. Patient denies suicidal or homicidal ideation. Denies paranoia, visual hallucinations. Patient reports he is taking medications as prescribed and is tolerating. Writer told patient he would not be going home today, gave him the anticipated discharge date of 02/16/2021. Patient accepted this. Encouragement and support provided.     Principal Problem: Schizoaffective disorder, bipolar type (HCC) Diagnosis: Principal Problem:   Schizoaffective disorder, bipolar type (HCC)  Total Time spent with patient: 1 hour  Past Psychiatric History: Per H&P: "Patient states that this is his second inpatient admission.  Chart review reveals an admission to [facility] in 2017 for agitation and anger toward clinic staff and his neighbor.  He was discharged from that admission on Depakote 1500 mg at bedtime and quetiapine 600 mg at bedtime.  He reports that he currently has no psychiatrist.  He was previously diagnosed with schizoaffective disorder, bipolar type.  He denies any history of suicide attempts.  The patient denies any history of nonsuicidal self-injurious behavior"  Past Medical History:  Past Medical History:  Diagnosis Date  . Asthma   . Bipolar affective (HCC)   . Hypertension   . Schizophrenia [facility])     Past Surgical History:  Procedure Laterality Date  . head injury    . Left knee     Family History:  Family History  Problem Relation Age of Onset  . Cancer Mother   . CAD Mother    Family Psychiatric  History: Per H&P "Patient denies any family history of  suicide.  He reports that he has a maternal uncle with mental health issues.  He reports that 2 of his maternal great uncles drank a lot of alcohol." Social History:  Social History   Substance and Sexual Activity  Alcohol Use Not Currently     Social History   Substance and Sexual Activity  Drug Use Not Currently  . Types: Marijuana    Social History   Socioeconomic History  . Marital status: Single    Spouse name: Not on file  . Number of children: Not on file  . Years of education: Not on file  . Highest education level: Not on file  Occupational History  . Not on file  Tobacco Use  . Smoking status: Current Every Day Smoker    Packs/day: 1.00    Types: Cigarettes  . Smokeless tobacco: Never Used  Substance and Sexual Activity  . Alcohol use: Not Currently  . Drug use: Not Currently    Types: Marijuana  . Sexual activity: Yes  Other Topics Concern  . Not on file  Social History Narrative  . Not on file   Social Determinants of Health   Financial Resource Strain: Not on file  Food Insecurity: Not on file  Transportation Needs: Not on file  Physical Activity: Not on file  Stress: Not on file  Social Connections: Not on file   Additional Social History:    Sleep: Good  Appetite:  Good  Current Medications: Current Facility-Administered Medications  Medication Dose Route Frequency  Provider Last Rate Last Admin  . acetaminophen (TYLENOL) tablet 650 mg  650 mg Oral Q6H PRN Nira Conn A, NP   650 mg at 02/12/21 0748  . albuterol (VENTOLIN HFA) 108 (90 Base) MCG/ACT inhaler 1-2 puff  1-2 puff Inhalation Q6H PRN Nira Conn A, NP   2 puff at 02/12/21 0746  . alum & mag hydroxide-simeth (MAALOX/MYLANTA) 200-200-20 MG/5ML suspension 30 mL  30 mL Oral Q4H PRN Nira Conn A, NP      . amLODipine (NORVASC) tablet 5 mg  5 mg Oral Daily Nira Conn A, NP   5 mg at 02/12/21 0742  . divalproex (DEPAKOTE ER) 24 hr tablet 750 mg  750 mg Oral BID Nira Conn A, NP    750 mg at 02/12/21 1721  . hydrOXYzine (ATARAX/VISTARIL) tablet 25 mg  25 mg Oral TID PRN Jackelyn Poling, NP   25 mg at 02/11/21 2136  . OLANZapine zydis (ZYPREXA) disintegrating tablet 10 mg  10 mg Oral Q8H PRN Jackelyn Poling, NP       And  . LORazepam (ATIVAN) tablet 1 mg  1 mg Oral PRN Nira Conn A, NP       And  . ziprasidone (GEODON) injection 20 mg  20 mg Intramuscular PRN Nira Conn A, NP      . magnesium hydroxide (MILK OF MAGNESIA) suspension 30 mL  30 mL Oral Daily PRN Nira Conn A, NP      . nicotine (NICODERM CQ - dosed in mg/24 hours) patch 21 mg  21 mg Transdermal Q0600 Nira Conn A, NP   21 mg at 02/12/21 0743  . risperiDONE (RISPERDAL M-TABS) disintegrating tablet 2 mg  2 mg Oral BID Nira Conn A, NP   2 mg at 02/12/21 1721  . tiZANidine (ZANAFLEX) tablet 4 mg  4 mg Oral Q6H PRN Nira Conn A, NP   4 mg at 02/11/21 2136  . traZODone (DESYREL) tablet 50 mg  50 mg Oral QHS PRN Jackelyn Poling, NP   50 mg at 02/11/21 2136    Lab Results:  Results for orders placed or performed during the hospital encounter of 02/11/21 (from the past 48 hour(s))  Lipid panel     Status: Abnormal   Collection Time: 02/12/21  6:15 AM  Result Value Ref Range   Cholesterol 150 0 - 200 mg/dL   Triglycerides 952 (H) <150 mg/dL   HDL 36 (L) >84 mg/dL   Total CHOL/HDL Ratio 4.2 RATIO   VLDL 41 (H) 0 - 40 mg/dL   LDL Cholesterol 73 0 - 99 mg/dL    Comment:        Total Cholesterol/HDL:CHD Risk Coronary Heart Disease Risk Table                     Men   Women  1/2 Average Risk   3.4   3.3  Average Risk       5.0   4.4  2 X Average Risk   9.6   7.1  3 X Average Risk  23.4   11.0        Use the calculated Patient Ratio above and the CHD Risk Table to determine the patient's CHD Risk.        ATP III CLASSIFICATION (LDL):  <100     mg/dL   Optimal  132-440  mg/dL   Near or Above  Optimal  130-159  mg/dL   Borderline  [id number]  mg/dL   High  >914>190     mg/dL   Very  High Performed at [facility], [address] [place], [place] [address]   Vitamin B12     Status: None   Collection Time: 02/12/21  6:15 AM  Result Value Ref Range   Vitamin B-12 433 180 - 914 pg/mL    Comment: (NOTE) This assay is not validated for testing neonatal or myeloproliferative syndrome specimens for Vitamin B12 levels. Performed at [facility], [address] [place], [place] [address]   CBC with Differential/Platelet     Status: Abnormal   Collection Time: 02/12/21  6:15 AM  Result Value Ref Range   WBC 6.2 4.0 - 10.5 K/uL   RBC 4.66 4.22 - 5.81 MIL/uL   Hemoglobin 16.1 13.0 - 17.0 g/dL   HCT 86.549.5 78.439.0 - 69.652.0 %   MCV 106.2 (H) 80.0 - 100.0 fL   MCH 34.5 (H) 26.0 - 34.0 pg   MCHC 32.5 30.0 - 36.0 g/dL   RDW 29.511.9 28.411.5 - 13.215.5 %   Platelets 208 150 - 400 K/uL   nRBC 0.0 0.0 - 0.2 %   Neutrophils Relative % 34 %   Neutro Abs 2.1 1.7 - 7.7 K/uL   Lymphocytes Relative 55 %   Lymphs Abs 3.4 0.7 - 4.0 K/uL   Monocytes Relative 9 %   Monocytes Absolute 0.6 0.1 - 1.0 K/uL   Eosinophils Relative 1 %   Eosinophils Absolute 0.1 0.0 - 0.5 K/uL   Basophils Relative 1 %   Basophils Absolute 0.0 0.0 - 0.1 K/uL   Immature Granulocytes 0 %   Abs Immature Granulocytes 0.01 0.00 - 0.07 K/uL    Comment: Performed at [facility], [address] [place], [place] [address]    Blood Alcohol level:  Lab Results  Component Value Date   [facility] 09/06/2019   ETH <10 04/12/2019    Metabolic Disorder Labs: Lab Results  Component Value Date   HGBA1C 5.9 (H) 02/10/2021   MPG 122.63 02/10/2021   MPG 120 03/12/2016   Lab Results  Component Value Date   PROLACTIN 24.1 (H) 03/12/2016   Lab Results  Component Value Date   CHOL 150 02/12/2021   TRIG 204 (H) 02/12/2021   HDL 36 (L) 02/12/2021   CHOLHDL 4.2 02/12/2021   VLDL 41 (H) 02/12/2021   LDLCALC 73 02/12/2021   LDLCALC 76 03/12/2016    Physical  Findings: AIMS:  , ,  ,  ,    CIWA:    COWS:     Musculoskeletal: Strength & Muscle Tone: within normal limits Gait & Station: normal Patient leans: N/A  Psychiatric Specialty Exam:  Presentation  General Appearance: Casual; Other (comment) (Mildly unkempt)  Eye Contact:Fair  Speech:Pressured  Speech Volume:Increased  Handedness:Right   Mood and Affect  Mood:Labile; Irritable; Anxious  Affect:Labile   Thought Process  Thought Processes:Disorganized  Descriptions of Associations:Tangential  Orientation:Full (Time, Place and Person)  Thought Content:Illogical; Paranoid Ideation; Rumination  History of Schizophrenia/Schizoaffective disorder:Yes  Duration of Psychotic Symptoms:Greater than six months  Hallucinations:Hallucinations: Auditory  Ideas of Reference:Paranoia  Suicidal Thoughts:Suicidal Thoughts: No  Homicidal Thoughts:Homicidal Thoughts: No   Sensorium  Memory:Immediate Fair; Recent Fair; Remote Fair  Judgment:Impaired  Insight:Poor   Executive Functions  Concentration:Fair  Attention Span:Fair  Recall:Fair  Fund of Knowledge:Fair  Language:Good   Psychomotor Activity  Psychomotor Activity:Psychomotor Activity:  Increased; Other (comment) (Asymmetric gait)   Assets  Assets:Communication Skills; Desire for Improvement; Financial Resources/Insurance; Housing; Resilience; Social Support   Sleep  Sleep:Sleep: Fair    Physical Exam: Physical Exam Vitals reviewed.  HENT:     Head: Normocephalic.     Nose: No congestion or rhinorrhea.  Eyes:     General:        Right eye: No discharge.        Left eye: No discharge.  Musculoskeletal:        General: Normal range of motion.     Cervical back: Normal range of motion.  Neurological:     Mental Status: He is oriented to person, place, and time.    Review of Systems  Psychiatric/Behavioral: Negative for depression (denies), hallucinations (denies), memory loss, substance  abuse and suicidal ideas. The patient is not nervous/anxious and does not have insomnia.   All other systems reviewed and are negative.  Blood pressure 123/86, pulse (!) 115, temperature 97.6 F (36.4 C), temperature source Oral, resp. rate 18, height 6\' 5"  (1.956 m), weight 104.3 kg, SpO2 100 %. Body mass index is 27.27 kg/m.   Treatment Plan Summary: Daily contact with patient to assess and evaluate symptoms and progress in treatment and Medication management Per H&P: "The patient is a 40 year old male with schizoaffective disorder, bipolar type currently displaying worsening psychotic and manic symptoms as well as thoughts of harming others in the context of incomplete adherence with psychiatric medications and lack of outpatient psychiatric provider.  Patient psychiatric hospitalization is necessary for treatment and stabilization of psychotic and mood symptoms and for safety of patient and others."  #Mood Stabilization -Continue Depakote 750 mg twice daily  -Continue risperidone 2 mg twice daily #Cardiovascular -Continue Norvasc 5 mg tablet daily #CNS Drugs -Continue Visteril 25 mg tablet 3 times daily prn anxiety -Continue nicotine patch 21 mg daily for smoking cessation -Continue Zyprexa 10 mg po or ativan 1 mg po or Geodon injection 20 mg for agitation protocol -Continue trazodone 50 mg at bedtime prn sleep -Continue risperidone 2 mg po 2 times daily #Muscle Spasms  -Continue Zanaflex 4 mg every 6 hours prn muscle spasms #Respiratory Agents -Continue Ventolin HFA , 1-2 puffs inhalation prn every 6 hoursfor wheezing, shortness of breath #PRN, Other  --Continue Tylenol 650 mg po every 6 hrs prn, mild pain --Continue MAALOX/MYLANTA 30 mL po every 4 hrs prn indigestion --Continue Milk of Magnesia 30 mL po daily prn mild constipation     24, NP, PMHNP-BC 02/12/2021, 6:04 PM

## 2021-02-12 NOTE — Care Management Important Message (Signed)
Medicare IM given to Social Work staff to give to the patient.

## 2021-02-12 NOTE — Progress Notes (Signed)
Pt visible on the unit, pt focused on D/C. Pt informed that he needs to continue Tx and focus on getting the things he needs to keep him from returning to the hospital right after D/C.     02/12/21 2100  Psych Admission Type (Psych Patients Only)  Admission Status Involuntary  Psychosocial Assessment  Patient Complaints Anxiety  Eye Contact Fair  Facial Expression Anxious  Affect Angry  Speech Logical/coherent  Interaction Minimal;Guarded  Motor Activity Slow;Shuffling  Appearance/Hygiene Unremarkable  Behavior Characteristics Anxious  Mood Preoccupied  Thought Process  Coherency WDL  Content WDL  Delusions None reported or observed  Perception WDL  Hallucination None reported or observed  Judgment Impaired  Confusion None  Danger to Self  Current suicidal ideation? Denies  Danger to Others  Danger to Others None reported or observed

## 2021-02-13 NOTE — Progress Notes (Signed)
Pt Bp elevated 136/104  . Pt given Amlodipine per MAR early per NP [facility]

## 2021-02-13 NOTE — BHH Group Notes (Signed)
Type of Therapy and Topic: Group Therapy: Anger Management   Participation:  Active  Description of Group: In this group, patients will learn helpful strategies and techniques to manage anger, express anger in alternative ways, change hostile attitudes, and prevent aggressive acts, such as verbal abuse and violence.This group will be process-oriented and eductional, with patients participating in exploration of their own experiences as well as giving and receiving support and challenge from other group members.  Therapeutic Goals: 1. Patient will learn to manage anger. 2. Patient will learn to stop violence or the threat of violence. 3. Patient will learn to develop self control over thoughts and actions. 4. Patient will receive support and feedback from others  George Kelley participated in group and accepted the worksheets that were provided.  George Kelley states that one thing he can control and manage is his anger.  George Kelley states that he does this by taking time to think and make a better choice.

## 2021-02-13 NOTE — Progress Notes (Signed)
[facility] MD Progress Note  02/13/2021 11:44 AM George Kelley  MRN:  [id number]   Subjective:  George Kelley " my neighbors was trippin."  George Kelley.  He reports history of paranoid schizophrenia, he stated " I needed help" and  [place] [facility] was racially profiling me.  Stated "I know my neighbors have been recording and monitoring me."  He denies suicidal or homicidal ideations.  Denies auditory or visual hallucinations. Continues to endorse paranoia.  Patient reported s he has been taking medications as directed denying any medications side effects.   George Kelley appears slightly disorganized and ongoing ruminations with [facility]. "  I am going to sue them for $1 million."  Per nursing staff patient is labile however redirectable.  Reports patient has been taking medications as indicated.  Chart reviewed medication adjustment to Risperdal 1 mg daily was titrated to 2 mg p.o. twice daily.  Patient to continue agitation protocol see chart.  Continue Depakote 750 mg p.o. twice daily.  Support encouragement reassurance was provided.   Principal Problem: Schizoaffective disorder, bipolar type (HCC) Diagnosis: Principal Problem:   Schizoaffective disorder, bipolar type (HCC)  Total Time spent with patient: 15 minutes  Past Psychiatric History:   Past Medical History:  Past Medical History:  Diagnosis Date  . Asthma   . Bipolar affective (HCC)   . Hypertension   . Schizophrenia George Kelley)     Past Surgical History:  Procedure Laterality Date  . head injury    . Left knee     Family History:  Family History  Problem Relation Age of Onset  . Cancer Mother   . CAD Mother    Family Psychiatric  History:  Social History:  Social History   Substance and Sexual Activity  Alcohol Use Not Currently     Social History   Substance and Sexual Activity  Drug Use Not Currently  . Types: Marijuana    Social History    Socioeconomic History  . Marital status: Single    Spouse name: Not on file  . Number of children: Not on file  . Years of education: Not on file  . Highest education level: Not on file  Occupational History  . Not on file  Tobacco Use  . Smoking status: Current Every Day Smoker    Packs/day: 1.00    Types: Cigarettes  . Smokeless tobacco: Never Used  Substance and Sexual Activity  . Alcohol use: Not Currently  . Drug use: Not Currently    Types: Marijuana  . Sexual activity: Yes  Other Topics Concern  . Not on file  Social History Narrative  . Not on file   Social Determinants of Health   Financial Resource Strain: Not on file  Food Insecurity: Not on file  Transportation Needs: Not on file  Physical Activity: Not on file  Stress: Not on file  Social Connections: Not on file   Additional Social History:                         Sleep: Fair  Appetite:  Fair  Current Medications: Current Facility-Administered Medications  Medication Dose Route Frequency Provider Last Rate Last Admin  . acetaminophen (TYLENOL) tablet 650 mg  650 mg Oral Q6H PRN George Kelley   650 mg at 02/12/21 0748  . albuterol (VENTOLIN HFA) 108 (90 Base) MCG/ACT inhaler 1-2 puff  1-2 puff Inhalation Q6H PRN George Kelley  2 puff at 02/13/21 0635  . alum & mag hydroxide-simeth (MAALOX/MYLANTA) 200-200-20 MG/5ML suspension 30 mL  30 mL Oral Q4H PRN George Kelley      . amLODipine (NORVASC) tablet 5 mg  5 mg Oral Daily George Kelley   5 mg at 02/13/21 0263  . divalproex (DEPAKOTE ER) 24 hr tablet 750 mg  750 mg Oral BID George Kelley   750 mg at 02/13/21 0802  . hydrOXYzine (ATARAX/VISTARIL) tablet 25 mg  25 mg Oral TID PRN George Kelley   25 mg at 02/11/21 2136  . OLANZapine zydis (ZYPREXA) disintegrating tablet 10 mg  10 mg Oral Q8H PRN George Kelley       And  . LORazepam (ATIVAN) tablet 1 mg  1 mg Oral PRN George Kelley       And  .  ziprasidone (GEODON) injection 20 mg  20 mg Intramuscular PRN George Kelley      . magnesium hydroxide (MILK OF MAGNESIA) suspension 30 mL  30 mL Oral Daily PRN George Kelley      . nicotine (NICODERM CQ - dosed in mg/24 hours) patch 21 mg  21 mg Transdermal Q0600 George Kelley   21 mg at 02/13/21 0804  . risperiDONE (RISPERDAL M-TABS) disintegrating tablet 2 mg  2 mg Oral BID George Kelley   2 mg at 02/13/21 0803  . tiZANidine (ZANAFLEX) tablet 4 mg  4 mg Oral Q6H PRN George Kelley   4 mg at 02/11/21 2136  . traZODone (DESYREL) tablet 50 mg  50 mg Oral QHS PRN George Kelley   50 mg at 02/11/21 2136    Lab Results:  Results for orders placed or performed during the hospital encounter of 02/11/21 (from the past 48 hour(s))  Lipid panel     Status: Abnormal   Collection Time: 02/12/21  6:15 AM  Result Value Ref Range   Cholesterol 150 0 - 200 mg/dL   Triglycerides 785 (H) <150 mg/dL   HDL 36 (L) >88 mg/dL   Total CHOL/HDL Ratio 4.2 RATIO   VLDL 41 (H) 0 - 40 mg/dL   LDL Cholesterol 73 0 - 99 mg/dL    Comment:        Total Cholesterol/HDL:CHD Risk Coronary Heart Disease Risk Table                     Men   Women  1/2 Average Risk   3.4   3.3  Average Risk       5.0   4.4  2 X Average Risk   9.6   7.1  3 X Average Risk  23.4   11.0        Use the calculated Patient Ratio above and the CHD Risk Table to determine the patient's CHD Risk.        ATP III CLASSIFICATION (LDL):  <100     mg/dL   Optimal  502-774  mg/dL   Near or Above                    Optimal  130-159  mg/dL   Borderline  128-786  mg/dL   High  >767     mg/dL   Very High Performed at [facility], [address] [place], [place] [address]   Vitamin B12     Status:  None   Collection Time: 02/12/21  6:15 AM  Result Value Ref Range   Vitamin B-12 433 180 - 914 pg/mL    Comment: (NOTE) This assay is not validated for testing neonatal or myeloproliferative syndrome  specimens for Vitamin B12 levels. Performed at [facility], [address] [place], [place] [address]   CBC with Differential/Platelet     Status: Abnormal   Collection Time: 02/12/21  6:15 AM  Result Value Ref Range   WBC 6.2 4.0 - 10.5 K/uL   RBC 4.66 4.22 - 5.81 MIL/uL   Hemoglobin 16.1 13.0 - 17.0 g/dL   HCT 60.449.5 54.039.0 - 98.152.0 %   MCV 106.2 (H) 80.0 - 100.0 fL   MCH 34.5 (H) 26.0 - 34.0 pg   MCHC 32.5 30.0 - 36.0 g/dL   RDW 19.111.9 47.811.5 - 29.515.5 %   Platelets 208 150 - 400 K/uL   nRBC 0.0 0.0 - 0.2 %   Neutrophils Relative % 34 %   Neutro Abs 2.1 1.7 - 7.7 K/uL   Lymphocytes Relative 55 %   Lymphs Abs 3.4 0.7 - 4.0 K/uL   Monocytes Relative 9 %   Monocytes Absolute 0.6 0.1 - 1.0 K/uL   Eosinophils Relative 1 %   Eosinophils Absolute 0.1 0.0 - 0.5 K/uL   Basophils Relative 1 %   Basophils Absolute 0.0 0.0 - 0.1 K/uL   Immature Granulocytes 0 %   Abs Immature Granulocytes 0.01 0.00 - 0.07 K/uL    Comment: Performed at [facility], [address] [place], [place] [address]    Blood Alcohol level:  Lab Results  Component Value Date   [facility]   ETH <10 04/12/2019    Metabolic Disorder Labs: Lab Results  Component Value Date   HGBA1C 5.9 (H) 02/10/2021   MPG 122.63 02/10/2021   MPG 120 03/12/2016   Lab Results  Component Value Date   PROLACTIN 24.1 (H) 03/12/2016   Lab Results  Component Value Date   CHOL 150 02/12/2021   TRIG 204 (H) 02/12/2021   HDL 36 (L) 02/12/2021   CHOLHDL 4.2 02/12/2021   VLDL 41 (H) 02/12/2021   LDLCALC 73 02/12/2021   LDLCALC 76 03/12/2016    Physical Findings: AIMS:  , ,  ,  ,    CIWA:    COWS:     Musculoskeletal: Strength & Muscle Tone: within normal limits Gait & Station: normal Patient leans: N/Kelley  Psychiatric Specialty Exam:  Presentation  General Appearance: Disheveled  Eye Contact:Good  Speech:Clear and Coherent  Speech Volume:Normal  Handedness:Left   Mood  and Affect  Mood:Anxious; Depressed; Irritable  Affect:Congruent   Thought Process  Thought Processes:Coherent  Descriptions of Associations:Intact  Orientation:Full (Time, Place and Person)  Thought Content:Logical  History of Schizophrenia/Schizoaffective disorder:Yes  Duration of Psychotic Symptoms:Greater than six months  Hallucinations:Hallucinations: Auditory  Ideas of Reference:Paranoia  Suicidal Thoughts:Suicidal Thoughts: No  Homicidal Thoughts:Homicidal Thoughts: No   Sensorium  Memory:Immediate Fair; Recent Fair; Remote Fair  Judgment:Fair  Insight:Fair   Executive Functions  Concentration:Fair  Attention Span:Fair  Recall:Fair  Fund of Knowledge:Fair  Language:Fair   Psychomotor Activity  Psychomotor Activity:Psychomotor Activity: Normal   Assets  Assets:Desire for Improvement; Talents/Skills; Social Support   Sleep  Sleep:Sleep: Fair    Physical Exam: Physical Exam Neurological:     Mental Status: He is alert.  Psychiatric:        Attention and Perception: Attention normal.  Mood and Affect: Mood is anxious and depressed.        Speech: Speech normal.        Behavior: Behavior is agitated.        Thought Content: Thought content is paranoid.        Cognition and Memory: Cognition and memory normal.        Judgment: Judgment normal.    ROS Blood pressure (!) 137/92, pulse (!) 120, temperature 97.7 F (36.5 C), temperature source Oral, resp. rate 16, height 6\' 5"  (1.956 m), weight 104.3 kg, SpO2 96 %. Body mass index is 27.27 kg/m.   Treatment Plan Summary: Daily contact with patient to assess and evaluate symptoms and progress in treatment and Medication management   Continue with current treatment plan on 02/13/2021 as indicated except for noted.  Schizoaffective disorder bipolar type:  Continue respite all 2 mg p.o. twice daily Continue Depakote 750 mg p.o. twice daily -4/19 valproic level less than 10 mg    Orders placed EKG and prolaction level , valproic level on 02/14/2021   See chart for agitation protocol  CSW to continue working on discharge disposition Patient encouraged to participate within the therapeutic milieu  02/16/2021, Kelley 02/13/2021, 11:44 AM

## 2021-02-13 NOTE — [facility] Suicide Risk Assessment (Signed)
[facility] INPATIENT:  Family/Significant Other Suicide Prevention Education  Suicide Prevention Education:  Education Completed; George Kelley [phone] (Wife) has been identified by the patient as the family member/significant other with whom the patient will be residing, and identified as the person(s) who will aid the patient in the event of a mental health crisis (suicidal ideations/suicide attempt).  With written consent from the patient, the family member/significant other has been provided the following suicide prevention education, prior to the and/or following the discharge of the patient.  The suicide prevention education provided includes the following:  Suicide risk factors  Suicide prevention and interventions  National Suicide Hotline telephone number  [facility] assessment telephone number  [facility] Emergency Assistance 911  [facility] and/or Residential Mobile Crisis Unit telephone number  Request made of family/significant other to:  Remove weapons (e.g., guns, rifles, knives), all items previously/currently identified as safety concern.    Remove drugs/medications (over-the-counter, prescriptions, illicit drugs), all items previously/currently identified as a safety concern.  The family member/significant other verbalizes understanding of the suicide prevention education information provided.  The family member/significant other agrees to remove the items of safety concern listed above.  CSW spoke with Mrs. George Kelley who states that she would like her husband to be released before Saturday Kelley at 11:00am because they have an appointment to handle buisness with their car.  Mrs. George Kelley states that her husband does have hallucinations but it is of his mother and grandmother and she states that these hallucinations keep him calm.  Mrs. George Kelley confirms that the neighbors have been spreading rumors about her husband and have been calling the police on  him.  Mrs. George Kelley states that her husband has never hit her and has not been aggressive.  Mrs. George Kelley states that her husband also has not ever spoken to her about suicidal thoughts.  Mrs. George Kelley states that they have been in the process of taking out a 50C on the neighbors and working on getting her husband an Investment banker, operational.  Mrs. George Kelley was very tearful during the conversation.  Mrs. George Kelley states that her husband can come home and that there are no firearms or weapons in the home.  CSW completed SPE with Mrs. George Kelley.    George Kelley 02/13/2021, 2:51 PM

## 2021-02-13 NOTE — Progress Notes (Signed)
Recreation Therapy Notes  Date:  4.22.22 Time: 0930 Location: [address] Dayroom  Group Topic: Stress Management  Goal Area(s) Addresses:  Patient will identify positive stress management techniques. Patient will identify benefits of using stress management post d/c.  Behavioral Response: Engaged  Intervention: Stress Management  Activity :  Progressive Muscle Relaxation.  LRT read a script that guided patients in tensing each muscle group individually and then relaxing it to release any tension they may have in the body.  Education:  Stress Management, Discharge Planning.   Education Outcome: Acknowledges Education  Clinical Observations/Feedback: Pt attended and participated in group activity.    [name], LRT/CTRS         Mikale Silversmith A 02/13/2021 10:56 AM 

## 2021-02-13 NOTE — Progress Notes (Signed)
   02/13/21 0700  Sleep  Number of Hours 6.25

## 2021-02-14 LAB — VALPROIC ACID LEVEL: Valproic Acid Lvl: 75 ug/mL (ref 50.0–100.0)

## 2021-02-14 MED ORDER — AMLODIPINE BESYLATE 10 MG PO TABS
10.0000 mg | ORAL_TABLET | Freq: Every day | ORAL | Status: DC
  Administered 2021-02-15: 10 mg via ORAL
  Filled 2021-02-14 (×3): qty 1

## 2021-02-14 MED ORDER — AMLODIPINE BESYLATE 5 MG PO TABS
5.0000 mg | ORAL_TABLET | Freq: Once | ORAL | Status: AC
  Administered 2021-02-14: 5 mg via ORAL
  Filled 2021-02-14 (×2): qty 1

## 2021-02-14 NOTE — [facility] Group Notes (Signed)
LCSW Group Therapy Note  02/14/2021     10:00-11:00AM  Type of Therapy and Topic:  Group Therapy:  Decisional Balance Exercise for Substance Use  Participation Level:  Minimal        . Description of Group:  The main focus of today's process group was learning how to use a decisional balance exercise to make a decision about whether to change an unhealthy coping skill, as well as how to use the information gathered in the actual process of planning that change.  Stages of Change were explained, then patients listed some of their most frequently utilized unhealthy coping techniques and CSW pointed out the similarities.  Motivational Interviewing and the whiteboard were utilized to help patients explore in-depth the perceived benefits and costs of a specific, shared unhealthy coping technique (drinking & drugging) as well as the benefits and costs of replacing that with other, healthy coping skills.    Therapeutic Goals Patient will be able to utilize the decision balance exercise on their own Patient will list coping skills they use to fulfill their needs Patient will identify the differences between healthy and  unhealthy coping skills Patient will verbalize the costs and benefits of drinking/drugging versus making the choice to change Patient will learn how to use the exercise to identify the most important supports to put in place so that they can succeed in a change to which they commit  Summary of Patient Progress: During group, patient expressed that he self-sabotages with his anger, and this is often because people ask questions about things that are not their business.  He said he does not lash out at those times, but rather clams up and keeps to himself.  Throughout the group he was working in a notebook on either crosswords or sudoku puzzles.  He had side conversations several times and had to be asked to stop.  He did not appear to gain any insight.   Therapeutic Modalities Cognitive  Behavioral Therapy Motivational Interviewing   Lynnell Chad, LCSW

## 2021-02-14 NOTE — Progress Notes (Signed)
   02/14/21 0500  Sleep  Number of Hours 5.5

## 2021-02-14 NOTE — Progress Notes (Signed)
[facility] MD Progress Note  02/14/2021 2:48 PM George Kelley  MRN:  [id number]   Subjective: Araceli reports, I'm doing well. I just want go home. When am I getting discharged? My mood is fine. I have been attending every group. I'm not suicidal or homicidal. I'm not hearing voices or seeing things. I'm ready to be discharged".  Objective: George Kelley 40 year old male with a history of schizoaffective disorder who presented on IVC brought in by law enforcement to behavioral health urgent care center for homicidal ideation towards police officer and towards patient's neighbors.  In the [facility], patient made statements that his neighbor was calling police and pretending to be his wife. Daily notes: George Kelley is seen, chart reviewed. He is alert, oriented & aware of situation. He is visible on the unit, attending group sessions. He is verbally responsive. He is eager to be discharged. He is asking when can he go home? He denies any symptoms of depression, anxiety, SIHI, AVH, delusional thoughts or paranoia. George Kelley is doing well per observation except when the the thought to go home gets in his head, then he becomes somewhat restless, mildly loud & ruminative, easily redirected & calmed. A review of his Depakote level done today 02-14-21 is within therapeutic level at 75. His blood pressure medication dose was adjusted today due to elevate blood pressure. It looks like he has met the maximum benefit of this hospitalization, likely will be discharged tomorrow morning. He is doing well on medications without any side effects.  Principal Problem: Schizoaffective disorder, bipolar type ([place]) Diagnosis: Principal Problem:   Schizoaffective disorder, bipolar type ([place])  Total Time spent with patient: 15 minutes  Past Psychiatric History: Schizoaffective disorder, bipolar-type.  Past Medical History:  Past Medical History:  Diagnosis Date  . Asthma   . Bipolar affective ([place])   . Hypertension   .  Schizophrenia [facility])     Past Surgical History:  Procedure Laterality Date  . head injury    . Left knee     Family History:  Family History  Problem Relation Age of Onset  . Cancer Mother   . CAD Mother    Family Psychiatric  History: See H&P Social History:  Social History   Substance and Sexual Activity  Alcohol Use Not Currently     Social History   Substance and Sexual Activity  Drug Use Not Currently  . Types: Marijuana    Social History   Socioeconomic History  . Marital status: Single    Spouse name: Not on file  . Number of children: Not on file  . Years of education: Not on file  . Highest education level: Not on file  Occupational History  . Not on file  Tobacco Use  . Smoking status: Current Every Day Smoker    Packs/day: 1.00    Types: Cigarettes  . Smokeless tobacco: Never Used  Substance and Sexual Activity  . Alcohol use: Not Currently  . Drug use: Not Currently    Types: Marijuana  . Sexual activity: Yes  Other Topics Concern  . Not on file  Social History Narrative  . Not on file   Social Determinants of Health   Financial Resource Strain: Not on file  Food Insecurity: Not on file  Transportation Needs: Not on file  Physical Activity: Not on file  Stress: Not on file  Social Connections: Not on file   Additional Social History:   Sleep: Good  Appetite:  Fair  Current Medications: Current Facility-Administered Medications  Medication Dose Route Frequency Provider Last Rate Last Admin  . acetaminophen (TYLENOL) tablet 650 mg  650 mg Oral Q6H PRN Lindon Romp A, NP   650 mg at 02/12/21 0748  . albuterol (VENTOLIN HFA) 108 (90 Base) MCG/ACT inhaler 1-2 puff  1-2 puff Inhalation Q6H PRN Lindon Romp A, NP   2 puff at 02/14/21 0352  . alum & mag hydroxide-simeth (MAALOX/MYLANTA) 200-200-20 MG/5ML suspension 30 mL  30 mL Oral Q4H PRN George Nunnery, NP      . Derrill Memo ON 02/15/2021] amLODipine (NORVASC) tablet 10 mg  10 mg Oral Daily  George Kelley I, NP      . amLODipine (NORVASC) tablet 5 mg  5 mg Oral Once George Kelley I, NP      . divalproex (DEPAKOTE ER) 24 hr tablet 750 mg  750 mg Oral BID Lindon Romp A, NP   750 mg at 02/14/21 0735  . hydrOXYzine (ATARAX/VISTARIL) tablet 25 mg  25 mg Oral TID PRN George Nunnery, NP   25 mg at 02/13/21 2109  . magnesium hydroxide (MILK OF MAGNESIA) suspension 30 mL  30 mL Oral Daily PRN Lindon Romp A, NP      . nicotine (NICODERM CQ - dosed in mg/24 hours) patch 21 mg  21 mg Transdermal Q0600 Lindon Romp A, NP   21 mg at 02/14/21 0741  . OLANZapine zydis (ZYPREXA) disintegrating tablet 10 mg  10 mg Oral Q8H PRN Lindon Romp A, NP       And  . ziprasidone (GEODON) injection 20 mg  20 mg Intramuscular PRN Lindon Romp A, NP      . risperiDONE (RISPERDAL M-TABS) disintegrating tablet 2 mg  2 mg Oral BID Lindon Romp A, NP   2 mg at 02/14/21 0735  . tiZANidine (ZANAFLEX) tablet 4 mg  4 mg Oral Q6H PRN Lindon Romp A, NP   4 mg at 02/13/21 2109  . traZODone (DESYREL) tablet 50 mg  50 mg Oral QHS PRN George Nunnery, NP   50 mg at 02/13/21 2109   Lab Results:  Results for orders placed or performed during the hospital encounter of 02/11/21 (from the past 48 hour(s))  Valproic acid level     Status: None   Collection Time: 02/14/21  6:55 AM  Result Value Ref Range   Valproic Acid Lvl 75 50.0 - 100.0 ug/mL    Comment: Performed at [facility] [place] [address], [place], [place] [address]   Blood Alcohol level:  Lab Results  Component Value Date   [facility] <10 09/06/2019   ETH <10 08/27/1593   Metabolic Disorder Labs: Lab Results  Component Value Date   HGBA1C 5.9 (H) 02/10/2021   MPG 122.63 02/10/2021   MPG 120 03/12/2016   Lab Results  Component Value Date   PROLACTIN 24.1 (H) 03/12/2016   Lab Results  Component Value Date   CHOL 150 02/12/2021   TRIG 204 (H) 02/12/2021   HDL 36 (L) 02/12/2021   CHOLHDL 4.2 02/12/2021   VLDL 41 (H) 02/12/2021   LDLCALC 73  02/12/2021   LDLCALC 76 03/12/2016   Physical Findings: AIMS:  , ,  ,  ,    CIWA:    COWS:     Musculoskeletal: Strength & Muscle Tone: within normal limits Gait & Station: normal Patient leans: N/A  Psychiatric Specialty Exam:  Presentation  General Appearance: Casual; Fairly Groomed  Eye Contact:Fair  Speech:Clear and Coherent  Speech Volume:Normal  Handedness:Right  Mood and Affect  Mood:Anxious; Irritable (Patient is constantly asking to be discharged!)  Affect:Constricted; Inappropriate   Thought Process  Thought Processes:Coherent  Descriptions of Associations:Intact  Orientation:Full (Time, Place and Person)  Thought Content:Rumination  History of Schizophrenia/Schizoaffective disorder:Yes  Duration of Psychotic Symptoms:Less than six months  Hallucinations:Hallucinations: None (Denies any psychotic symptoms today) Description of Auditory Hallucinations: NA  Ideas of Reference:None  Suicidal Thoughts:Suicidal Thoughts: No  Homicidal Thoughts:Homicidal Thoughts: No   Sensorium  Memory:Immediate Fair; Recent Fair; Remote Fair  Judgment:Fair  Insight:Fair  Executive Functions  Concentration:Poor (Restless about getting discharged)  Attention Span:Fair  [place]  Language:Good   Psychomotor Activity  Psychomotor Activity:Psychomotor Activity: Restlessness   Assets  Assets:Communication Skills; Resilience; Social Support   Sleep  Sleep:Sleep: Good Number of Hours of Sleep: 6.5   Physical Exam: Physical Exam Vitals and nursing note reviewed.  HENT:     Head: Normocephalic.     Nose: Nose normal.     Mouth/Throat:     Pharynx: Oropharynx is clear.  Eyes:     Pupils: Pupils are equal, round, and reactive to light.  Cardiovascular:     Rate and Rhythm: Normal rate.  Musculoskeletal:     Cervical back: Normal range of motion.  Neurological:     Mental Status: He is alert.  Psychiatric:         Attention and Perception: Attention normal.        Mood and Affect: Mood is anxious and depressed.        Speech: Speech normal.        Behavior: Behavior is agitated.        Thought Content: Thought content is paranoid.        Cognition and Memory: Cognition and memory normal.        Judgment: Judgment normal.    Review of Systems  Constitutional: Negative.   HENT: Negative.   Eyes: Negative.   Respiratory: Negative for cough, shortness of breath and wheezing.   Cardiovascular: Negative for chest pain and palpitations.       Elevated BP 141/111  Elevated pulse rate: 108.  Patient has bee tensed about getting discharged. His blood pressure medicine dose has been adjusted  Gastrointestinal: Negative.   Genitourinary: Negative.   Musculoskeletal: Negative.   Skin: Negative.   Neurological: Negative for dizziness, tingling, tremors, sensory change, speech change, focal weakness, seizures, loss of consciousness, weakness and headaches.  Endo/Heme/Allergies:       See allergy lists.  Psychiatric/Behavioral: Positive for substance abuse. Negative for depression, hallucinations (Hx of), memory loss and suicidal ideas. The patient is nervous/anxious (about getting discharged). The patient does not have insomnia.    Blood pressure (!) 141/111, pulse (!) 108, temperature 97.6 F (36.4 C), temperature source Oral, resp. rate 20, height '6\' 5"'  (1.956 m), weight 104.3 kg, SpO2 96 %. Body mass index is 27.27 kg/m.  Treatment Plan Summary: Daily contact with patient to assess and evaluate symptoms and progress in treatment and Medication management  Continue inpatient hospitalization. Will continue today 02/14/2021 plan as below except where it is noted.  Schizoaffective disorder bipolar type:  Mood control/stabilization. Continue Risperdal 2 mg p.o. twice daily. Continue Depakote 750 mg p.o. twice daily Depakote level of 02-14-21 reviewed:  75, within therapeutic  level.  Anxiety. Continue Vistaril 25 mg po tid prn.  Insomnia. Continue Trazodone 50 mg po Q hs prn.  Other medications: Continue: Tylenol 650 mg po Q 4 hrs prn for pain/fever.  Albuterol inhaler 1-2 puffs prn for SOB. Mylanta 30 ml po Q 4 hrs prn for indigestion. Increased Amlodipine 10 mg po due to increased B/P. Amlodipine 5 mg po once today for elevated b/p. Mom 30 ml po daily prn for constipation. Zanaflex 4 mg po Q 6 hrs prn for muscle spasm.   EKG result of 02-15-23 reviewed: Normal  Continue the agitation protocol as recommended.  CSW to continue working on discharge disposition Patient encouraged to participate within the therapeutic milieu  George Spar, NP, PMHNP, FNP-BBC 02/14/2021, 2:48 PMPatient ID: George Kelley, male   DOB: 05/16/1981, 40 y.o.   MRN: [id number]

## 2021-02-14 NOTE — Progress Notes (Signed)
   02/14/21 1030  Psych Admission Type (Psych Patients Only)  Admission Status Involuntary  Psychosocial Assessment  Patient Complaints Anxiety  Eye Contact Fair  Facial Expression Anxious  Affect Anxious;Irritable  Speech Logical/coherent  Interaction Minimal;Guarded  Motor Activity Slow;Shuffling  Appearance/Hygiene Unremarkable  Behavior Characteristics Anxious  Mood Anxious;Suspicious  Thought Process  Coherency WDL  Content WDL  Delusions None reported or observed  Perception WDL  Hallucination None reported or observed  Judgment Impaired  Confusion None  Danger to Self  Current suicidal ideation? Denies  Danger to Others  Danger to Others None reported or observed

## 2021-02-14 NOTE — Progress Notes (Signed)
Pt given PRN Vistaril, Trazodone, Zanaflex, Inhaler per MAR . Pt visible on the unit this evening, pt stated he was just ready to D/C.     02/12/21 2100  Psych Admission Type (Psych Patients Only)  Admission Status Involuntary  Psychosocial Assessment  Patient Complaints Anxiety  Eye Contact Fair  Facial Expression Anxious  Affect Angry  Speech Logical/coherent  Interaction Minimal;Guarded  Motor Activity Slow;Shuffling  Appearance/Hygiene Unremarkable  Behavior Characteristics Anxious  Mood Preoccupied  Thought Process  Coherency WDL  Content WDL  Delusions None reported or observed  Perception WDL  Hallucination None reported or observed  Judgment Impaired  Confusion None  Danger to Self  Current suicidal ideation? Denies  Danger to Others  Danger to Others None reported or observed

## 2021-02-14 NOTE — Progress Notes (Signed)
  D:  Patient presents with high anxiety today. Patient currently denies suicidal ideation and verbally contracts for safety. Patient's goal today is to go home. Patient denies HI and AVH.  A:  Labs/Vitals monitored; Medication education provided; Patient supported emotionally; Patient asked to communicate his needs, concerns and questions.  R:  Patient remains safe with 15 minute checks; Will continue POC.

## 2021-02-15 LAB — PROLACTIN: Prolactin: 32.8 ng/mL — ABNORMAL HIGH (ref 4.0–15.2)

## 2021-02-15 MED ORDER — NICOTINE 21 MG/24HR TD PT24: 21.0000 mg | MEDICATED_PATCH | Freq: Every day | TRANSDERMAL | 0 refills | Status: AC

## 2021-02-15 MED ORDER — AMLODIPINE BESYLATE 10 MG PO TABS: 10.0000 mg | ORAL_TABLET | Freq: Every day | ORAL | 0 refills | Status: AC

## 2021-02-15 MED ORDER — TRAZODONE HCL 50 MG PO TABS: 50.0000 mg | ORAL_TABLET | Freq: Every evening | ORAL | 0 refills | Status: DC | PRN

## 2021-02-15 MED ORDER — HYDROXYZINE HCL 25 MG PO TABS: 25.0000 mg | ORAL_TABLET | Freq: Three times a day (TID) | ORAL | 0 refills | Status: DC | PRN

## 2021-02-15 MED ORDER — TIZANIDINE HCL 4 MG PO TABS: 4.0000 mg | ORAL_TABLET | Freq: Four times a day (QID) | ORAL | 0 refills | Status: AC | PRN

## 2021-02-15 MED ORDER — DIVALPROEX SODIUM ER 250 MG PO TB24: 750.0000 mg | ORAL_TABLET | Freq: Two times a day (BID) | ORAL | 0 refills | Status: DC

## 2021-02-15 MED ORDER — DIVALPROEX SODIUM ER 500 MG PO TB24
750.0000 mg | ORAL_TABLET | Freq: Two times a day (BID) | ORAL | Status: DC
  Filled 2021-02-15 (×5): qty 1

## 2021-02-15 MED ORDER — RISPERIDONE 2 MG PO TBDP: 2.0000 mg | ORAL_TABLET | Freq: Two times a day (BID) | ORAL | 0 refills | Status: DC
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
## 2021-02-15 NOTE — BHH Suicide Risk Assessment (Signed)
[facility] Discharge Suicide Risk Assessment   Principal Problem: Schizoaffective disorder, bipolar type [facility]) Discharge Diagnoses: Principal Problem:   Schizoaffective disorder, bipolar type ([facility])   Total Time spent with patient: 15 minutes  Musculoskeletal: Strength & Muscle Tone: within normal limits Gait & Station: normal Patient leans: N/A  Psychiatric Specialty Exam: Review of Systems  All other systems reviewed and are negative.   Blood pressure (!) (P) 126/95, pulse (!) (P) 115, temperature 97.6 F (36.4 C), temperature source Oral, resp. rate 20, height 6\' 5"  (1.956 m), weight 104.3 kg, SpO2 96 %.Body mass index is 27.27 kg/m.  General Appearance: Casual  Eye Contact::  Fair  Speech:  Normal Rate409  Volume:  Normal  Mood:  Euthymic  Affect:  Congruent  Thought Process:  Coherent and Descriptions of Associations: Intact  Orientation:  Full (Time, Place, and Person)  Thought Content:  Logical  Suicidal Thoughts:  No  Homicidal Thoughts:  No  Memory:  Immediate;   Fair Recent;   Fair Remote;   Fair  Judgement:  Intact  Insight:  Fair  Psychomotor Activity:  Normal  Concentration:  Fair  Recall:  [phone] of Knowledge:Fair  Language: Good  Akathisia:  Negative  Handed:  Right  AIMS (if indicated):     Assets:  Desire for Improvement Financial Resources/Insurance Housing Resilience Social Support  Sleep:  Number of Hours: 6.75  Cognition: WNL  ADL's:  Intact   Mental Status Per Nursing Assessment::   On Admission:  NA  Demographic Factors:  Male and Unemployed  Loss Factors: NA  Historical Factors: Impulsivity  Risk Reduction Factors:   Living with another person, especially a relative and Positive social support  Continued Clinical Symptoms:  Bipolar Disorder:   Mixed State  Cognitive Features That Contribute To Risk:  None    Suicide Risk:  Minimal: No identifiable suicidal ideation.  Patients presenting with no risk factors but with  morbid ruminations; may be classified as minimal risk based on the severity of the depressive symptoms   Follow-up Information    [facility]. Go on 02/23/2021.   Specialty: Behavioral Health Why: You have an appointment to obtain therapy services on 02/23/21 at 7:45 am, and you have an appointment for medication management on 03/16/21 at 7:45 am.  These appts are held in person and are first come, first served.  You may request a male clinician. Contact information: [address] [place] [phone]       [facility] Call.   Why: Referral has been made to this [facility] Treatment Team.  Please call them to follow up. Contact information: [address] [phone] [place] [phone]               Plan Of Care/Follow-up recommendations:  Activity:  ad lib  [phone], MD 02/15/2021, 9:30 AM

## 2021-02-15 NOTE — Plan of Care (Signed)
Nurse discussed coping skills with patient.  

## 2021-02-15 NOTE — Discharge Summary (Signed)
Physician Discharge Summary Note  Patient:  George Kelley is an 40 y.o., male MRN:  [id number] DOB:  01-Apr-1981 Patient phone:  [phone] (home)  Patient address:   [address] [place] [id number],  Total Time spent with patient: Greater than 30 minutes  Date of Admission:  02/11/2021 Date of Discharge: 02-15-21  Reason for Admission: Homicidal ideation towards police officer and towards patient's neighbors.  Principal Problem: Schizoaffective disorder, bipolar type [facility](HCC)  Discharge Diagnoses: Principal Problem:   Schizoaffective disorder, bipolar type [facility](HCC)  Past Psychiatric History: Schizoaffective disorder, Bipolar disorder.  Past Medical History:  Past Medical History:  Diagnosis Date  . Asthma   . Bipolar affective (HCC)   . Hypertension   . Schizophrenia [facility](HCC)     Past Surgical History:  Procedure Laterality Date  . head injury    . Left knee     Family History:  Family History  Problem Relation Age of Onset  . Cancer Mother   . CAD Mother    Family Psychiatric  History: See H&P  Social History:  Social History   Substance and Sexual Activity  Alcohol Use Not Currently     Social History   Substance and Sexual Activity  Drug Use Not Currently  . Types: Marijuana    Social History   Socioeconomic History  . Marital status: Single    Spouse name: Not on file  . Number of children: Not on file  . Years of education: Not on file  . Highest education level: Not on file  Occupational History  . Not on file  Tobacco Use  . Smoking status: Current Every Day Smoker    Packs/day: 1.00    Types: Cigarettes  . Smokeless tobacco: Never Used  Substance and Sexual Activity  . Alcohol use: Not Currently  . Drug use: Not Currently    Types: Marijuana  . Sexual activity: Yes  Other Topics Concern  . Not on file  Social History Narrative  . Not on file   Social Determinants of Health   Financial Resource Strain: Not on file  Food  Insecurity: Not on file  Transportation Needs: Not on file  Physical Activity: Not on file  Stress: Not on file  Social Connections: Not on file   Hospital Course: (Per Md's admission evaluation notes): George Kelley 40 year old male with a history of schizoaffective disorder who presented on IVC brought in by law enforcement to behavioral health urgent care center for homicidal ideation towards police officer and towards patient's neighbors.  [facility] patient made statements that his neighbor was calling police and pretending to be his wife.  Patient went to the police department to complain about his neighbor and ended up being restrained by police and transported to behavioral health urgent care.  In the [facility], the patient reported symptoms of auditory hallucinations.  [facility]. On interview with me today, the patient is cooperative and polite but displays hyperverbal pressured speech, mild motor agitation, disorganized and tangential thought processes, anxious and irritable mood, labile affect and requires frequent redirection. The patient states that he is in the hospital because "I need the ACT team."  Patient reports hearing the voice of his neighbor making comments about him such as, "He ain't dead.  He ain't locked up.  He need to be dead."  Patient states that he believes his neighbor is jealous of him.    Prior to this discharge, George Kelley was seen & evaluated  for mental health stability. The current laboratory findings were reviewed (stable), nurses notes & vital signs were reviewed as well. There are no current mental health or medical issues that should prevent this discharge at this time. Patient is being discharged to continue mental health issues as noted below.  This istheseconddischarge summary for this45year old male from this BHhospital. He was a patient in this hospital in 2017 for mood stabilization treatments.George Kelley hasaprevious hx of mental  illness.He was brought to the hospital this time around forhomicidal ideation towards the police & his neighbors. He was in need of evaluation of mental health evaluation.  After evaluation of hispresenting symptoms, it was jointly agreed by the treatment team torecommend patientfor mood stabilization treatments. Themedication regimentargeting those presenting symptomswere discussed &initiatedwith hisconsent. Hewas treated, stabilized & discharged on themedicationsas listed below on his discharge medication lists. He was also enrolled & participated in the group counseling sessions being offered & held on this unit.He learned coping skills.He presented other significant medical issues that required treatment  & or monitoring.He was resumed & discharged on all hispertinent home medications for those health issues. He tolerated histreatment regimen without any adverse effects or reactions reported.   George Kelley symptoms responded well to histreatment regimen warranting this discharge. This is evidenced by hisreports of improved mood, resolution of symptoms &presentation of good affect/eye contact.Heiscurrently mentally & medically stable for dischargeto continue mental health careas recommended below on an outpatient basis..   Today upon this discharge evaluationwith the attending psychiatrist today,George Kelley he is doing well.He denies any specific concerns.He is sleeping well. Hisappetite is good. He denies other physical complaints.He denies SI/HI/AH/VH, delusional thoughts or paranoia.He is tolerating hismedications well &in agreement to continue hiscurrent regimen as recommended.He will follow up for routine psychiatric care&medication management as noted below. He is provided with all the necessary information needed to make these appointments without problems. He was able to engage in safety planning including plan to [facility] or contact emergency  services if he feels unable to maintain hisown safety or the safety of others. Pt had no further questions, comments or concerns. He left [facility] with all personal belongings in no apparent distress. Transportation per his wife.  Physical Findings: AIMS:  , ,  ,  ,    CIWA:    COWS:     Musculoskeletal: Strength & Muscle Tone: within normal limits Gait & Station: normal Patient leans: N/A  Psychiatric Specialty Exam:  Presentation  General Appearance: Casual; Fairly Groomed  Eye Contact:Fair  Speech:Clear and Coherent  Speech Volume:Normal  Handedness:Right  Mood and Affect  Mood:Anxious; Irritable (Patient is constantly asking to be discharged!)  Affect: Appropriate  Thought Process  Thought Processes:Coherent  Descriptions of Associations:Intact  Orientation:Full (Time, Place and Person)  Thought Content:Rumination  History of Schizophrenia/Schizoaffective disorder:Yes  Duration of Psychotic Symptoms:Less than six months  Hallucinations:Hallucinations: None (Denies any psychotic symptoms today) Description of Auditory Hallucinations: NA  Ideas of Reference:None  Suicidal Thoughts:Suicidal Thoughts: No  Homicidal Thoughts:Homicidal Thoughts: No   Sensorium  Memory:Immediate Fair; Recent Fair; Remote Fair  Judgment:Fair  Insight:Fair  Executive Functions  Concentration:Poor (Restless about getting discharged)  Attention Span:Fair  Recall:Fair  Fund of Knowledge:Fair  Language:Good  Psychomotor Activity  Psychomotor Activity:Psychomotor Activity: Restlessness  Assets  Assets:Communication Skills; Resilience; Social Support   Sleep  Sleep:Sleep: Good Number of Hours of Sleep: 6.5  Physical Exam: Physical Exam Vitals and nursing note reviewed.  HENT:     Head: Normocephalic.     Nose: Nose  normal.     Mouth/Throat:     Pharynx: Oropharynx is clear.  Eyes:     Pupils: Pupils are equal, round, and reactive to light.   Cardiovascular:     Rate and Rhythm: Normal rate.     Pulses: Normal pulses.  Pulmonary:     Effort: Pulmonary effort is normal.  Genitourinary:    Comments: Deferred Musculoskeletal:        General: Normal range of motion.     Cervical back: Normal range of motion.  Skin:    General: Skin is warm and dry.  Neurological:     General: No focal deficit present.     Mental Status: He is alert and oriented to person, place, and time. Mental status is at baseline.    Review of Systems  Constitutional: Negative.   HENT: Negative.   Eyes: Negative.   Respiratory: Negative.   Cardiovascular:       Hx. HTN  Gastrointestinal: Negative.   Genitourinary: Negative.   Musculoskeletal: Negative.   Skin: Negative.   Neurological: Negative for dizziness, tingling, tremors, sensory change, speech change, focal weakness, seizures, loss of consciousness, weakness and headaches.  Endo/Heme/Allergies:       Allergies: See list.  Psychiatric/Behavioral: Positive for depression (Hx. of (stabilized with medication prior to discharge)) and hallucinations (Hx. of (stabilized with medication prior to discharge)). Negative for memory loss, substance abuse and suicidal ideas. The patient has insomnia (Hx of (stabilized with medication prior to discharge)). The patient is not nervous/anxious (Stable upon discharge).    Blood pressure (!) 126/95, pulse (!) 115, temperature 97.6 F (36.4 C), temperature source Oral, resp. rate 20, height 6\' 5"  (1.956 m), weight 104.3 kg, SpO2 96 %. Body mass index is 27.27 kg/m.  Have you used any form of tobacco in the last 30 days? (Cigarettes, Smokeless Tobacco, Cigars, and/or Pipes): Yes  Has this patient used any form of tobacco in the last 30 days? (Cigarettes, Smokeless Tobacco, Cigars, and/or Pipes): N/A  Blood Alcohol level:  Lab Results  Component Value Date   ETH <10 09/06/2019   ETH <10 04/12/2019    Metabolic Disorder Labs:  Lab Results  Component  Value Date   HGBA1C 5.9 (H) 02/10/2021   MPG 122.63 02/10/2021   MPG 120 03/12/2016   Lab Results  Component Value Date   PROLACTIN 32.8 (H) 02/14/2021   PROLACTIN 24.1 (H) 03/12/2016   Lab Results  Component Value Date   CHOL 150 02/12/2021   TRIG 204 (H) 02/12/2021   HDL 36 (L) 02/12/2021   CHOLHDL 4.2 02/12/2021   VLDL 41 (H) 02/12/2021   LDLCALC 73 02/12/2021   LDLCALC 76 03/12/2016   See Psychiatric Specialty Exam and Suicide Risk Assessment completed by Attending Physician prior to discharge.  Discharge destination:  Home  Is patient on multiple antipsychotic therapies at discharge:  No   Has Patient had three or more failed trials of antipsychotic monotherapy by history:  No  Recommended Plan for Multiple Antipsychotic Therapies: NA  Allergies as of 02/15/2021      Reactions   Pork-derived Products    Pt states he doesn't Eat pork at all due to religious preference    Tomato Swelling      Medication List    STOP taking these medications   QUEtiapine 300 MG tablet Commonly known as: SEROQUEL     TAKE these medications     Indication  albuterol 108 (90 Base) MCG/ACT inhaler Commonly known as: VENTOLIN HFA  Inhale 1-2 puffs into the lungs every 6 (six) hours as needed for wheezing or shortness of breath.  Indication: Asthma, Chronic Obstructive Lung Disease   amLODipine 10 MG tablet Commonly known as: NORVASC Take 1 tablet (10 mg total) by mouth daily. For high blood pressure Start taking on: February 16, 2021 What changed:   medication strength  how much to take  additional instructions  Indication: High Blood Pressure Disorder   divalproex 250 MG 24 hr tablet Commonly known as: DEPAKOTE ER Take 3 tablets (750 mg total) by mouth every 12 (twelve) hours. For mood stabilization What changed:   medication strength  how much to take  when to take this  additional instructions  Indication: Mood stabilization   hydrOXYzine 25 MG tablet Commonly  known as: ATARAX/VISTARIL Take 1 tablet (25 mg total) by mouth 3 (three) times daily as needed for anxiety.  Indication: Feeling Anxious   nicotine 21 mg/24hr patch Commonly known as: NICODERM CQ - dosed in mg/24 hours Place 1 patch (21 mg total) onto the skin daily at 6 (six) AM. (May buy from over the counter): For smoking cessation  Indication: Nicotine Addiction   risperiDONE 2 MG disintegrating tablet Commonly known as: RISPERDAL M-TABS Take 1 tablet (2 mg total) by mouth 2 (two) times daily. For mood control  Indication: Mood control   tiZANidine 4 MG tablet Commonly known as: Zanaflex Take 1 tablet (4 mg total) by mouth every 6 (six) hours as needed for muscle spasms.  Indication: Muscle Spasticity   traZODone 50 MG tablet Commonly known as: DESYREL Take 1 tablet (50 mg total) by mouth at bedtime as needed for sleep. What changed:   when to take this  reasons to take this  Indication: Trouble Sleeping       Follow-up Information    [facility]. Go on 02/23/2021.   Specialty: Behavioral Health Why: You have an appointment to obtain therapy services on 02/23/21 at 7:45 am, and you have an appointment for medication management on 03/16/21 at 7:45 am.  These appts are held in person and are first come, first served.  You may request a male clinician. Contact information: [address] [place] [address] [phone]       [facility] Call.   Why: Referral has been made to this [facility] Team.  Please call them to follow up. Contact information: [address] [place] [address] [phone]             = Follow-up recommendations: Activity:  As tolerated Diet: As recommended by your primary care doctor. Keep all scheduled follow-up appointments as recommended.  Comments: Prescriptions given at discharge.  Patient agreeable to plan.  Given opportunity to ask questions.  Appears  to feel comfortable with discharge denies any current suicidal or homicidal thought. Patient is also instructed prior to discharge to: Take all medications as prescribed by his/her mental healthcare provider. Report any adverse effects and or reactions from the medicines to his/her outpatient provider promptly. Patient has been instructed & cautioned: To not engage in alcohol and or illegal drug use while on prescription medicines. In the event of worsening symptoms, patient is instructed to call the crisis hotline, 911 and or go to the nearest ED for appropriate evaluation and treatment of symptoms. To follow-up with his/her primary care provider for your other medical issues, concerns and or health care needs.  Signed: Armandina Stammer, NP, PMHNP, FNP-BC 02/15/2021, 11:07 AM

## 2021-02-15 NOTE — Progress Notes (Addendum)
Discharge Note:  Patient discharged home with girlfriend.  Suicide prevention information given and discussed with patient who stated he understood and had no questions.   Patient denied SI and HI.  Denied A/V hallucinations.  Patient stated he received all his belongings.  All required discharge information given.  Patient stated he appreciated all assistance received from [facility] staff.

## 2021-02-15 NOTE — Progress Notes (Signed)
  [facility] Adult Case Management Discharge Plan :  Will you be returning to the same living situation after discharge:  Yes,  with family At discharge, do you have transportation home?: Yes,  wife Do you have the ability to pay for your medications: Yes,  Medicare and income  Release of information consent forms completed and emailed to Medical Records, then turned in to Medical Records by CSW.   Patient to Follow up at:  Follow-up Information    [facility]. Go on 02/23/2021.   Specialty: Behavioral Health Why: You have an appointment to obtain therapy services on 02/23/21 at 7:45 am, and you have an appointment for medication management on 03/16/21 at 7:45 am.  These appts are held in person and are first come, first served.  You may request a male clinician. Contact information: [address] [place] [address] [phone]       [facility] Call.   Why: Referral has been made to this [facility] Treatment Team.  Please call them to follow up. Contact information: [address] [place] [address] [phone]               Next level of care provider has access to [facility] Link:no  Safety Planning and Suicide Prevention discussed: Yes,  with wife  Have you used any form of tobacco in the last 30 days? (Cigarettes, Smokeless Tobacco, Cigars, and/or Pipes): Yes  Has patient been referred to the Quitline?: Patient refused referral  Patient has been referred for addiction treatment: Yes  Lynnell Chad, LCSW 02/15/2021, 9:11 AM

## 2021-02-15 NOTE — Progress Notes (Signed)
   02/14/21 2204  Psych Admission Type (Psych Patients Only)  Admission Status Involuntary  Psychosocial Assessment  Patient Complaints None  Eye Contact Fair  Facial Expression Anxious  Affect Anxious;Appropriate to circumstance  Speech Logical/coherent  Interaction Assertive  Motor Activity Slow;Shuffling  Appearance/Hygiene Unremarkable  Behavior Characteristics Cooperative;Appropriate to situation  Mood Anxious;Pleasant  Thought Process  Coherency WDL  Content WDL  Delusions None reported or observed  Perception WDL  Hallucination None reported or observed  Judgment Impaired  Confusion None  Danger to Self  Current suicidal ideation? Denies  Danger to Others  Danger to Others None reported or observed

## 2021-02-15 NOTE — Progress Notes (Signed)
D:  Patient's self inventory sheet, patient sleeps good, no sleep medication.  Good appetite, normal energy level, good concentration.  Denied withdrawals.  Denied SI.  Denied physical problems.  Goal is see family today.  Plans to discharge home. Unsure of discharge plans. A:  Medications administered per MD orders.  Emotional support and encouragement given patient. R:  Denied SI and HI, contracts for safety.  Denied A/V hallucinations.  Denied pain.  Safety maintained with 15 minute checks.

## 2021-02-16 ENCOUNTER — Emergency Department (HOSPITAL_COMMUNITY): Payer: 59

## 2021-02-16 ENCOUNTER — Encounter (HOSPITAL_COMMUNITY): Payer: Self-pay | Admitting: *Deleted

## 2021-02-16 ENCOUNTER — Other Ambulatory Visit: Payer: Self-pay

## 2021-02-16 ENCOUNTER — Emergency Department (HOSPITAL_COMMUNITY)
Admission: EM | Admit: 2021-02-16 | Discharge: 2021-02-17 | Disposition: A | Discharge status: Home / Self Care | Payer: 59 | Attending: Emergency Medicine | Admitting: Emergency Medicine

## 2021-02-16 DIAGNOSIS — I10 Essential (primary) hypertension: Secondary | ICD-10-CM | POA: Diagnosis not present

## 2021-02-16 DIAGNOSIS — M25512 Pain in left shoulder: Secondary | ICD-10-CM | POA: Diagnosis not present

## 2021-02-16 DIAGNOSIS — J45909 Unspecified asthma, uncomplicated: Secondary | ICD-10-CM | POA: Diagnosis not present

## 2021-02-16 DIAGNOSIS — Z79899 Other long term (current) drug therapy: Secondary | ICD-10-CM | POA: Diagnosis not present

## 2021-02-16 DIAGNOSIS — F17210 Nicotine dependence, cigarettes, uncomplicated: Secondary | ICD-10-CM | POA: Diagnosis not present

## 2021-02-16 DIAGNOSIS — M25551 Pain in right hip: Secondary | ICD-10-CM | POA: Diagnosis not present

## 2021-02-16 DIAGNOSIS — M25511 Pain in right shoulder: Secondary | ICD-10-CM

## 2021-02-16 MED ORDER — IBUPROFEN 800 MG PO TABS
800.0000 mg | ORAL_TABLET | Freq: Once | ORAL | Status: DC
  Filled 2021-02-16: qty 1

## 2021-02-16 MED ORDER — ACETAMINOPHEN 500 MG PO TABS
1000.0000 mg | ORAL_TABLET | Freq: Once | ORAL | Status: DC
  Filled 2021-02-16: qty 2

## 2021-02-16 MED ORDER — LIDOCAINE 5 % EX PTCH
3.0000 | MEDICATED_PATCH | CUTANEOUS | Status: DC
  Filled 2021-02-16: qty 3

## 2021-02-16 NOTE — ED Provider Notes (Signed)
[facility]-EMERGENCY DEPT Provider Note   CSN: [id number] Arrival date & time: 02/16/21  2219     History Chief Complaint  Patient presents with  . Shoulder Pain    George Kelley is a 40 y.o. male.  The history is provided by the patient.  Shoulder Pain Location:  Shoulder Shoulder location:  R shoulder and L shoulder Upper extremity injury: handcuffed    Pain details:    Quality:  Aching   Radiates to:  Does not radiate   Severity:  Moderate   Onset quality:  Sudden   Timing:  Constant   Progression:  Unchanged Dislocation: no   Prior injury to area:  No Relieved by:  Nothing Worsened by:  Nothing Ineffective treatments:  None tried Associated symptoms: no back pain, no decreased range of motion, no fatigue, no fever, no muscle weakness, no neck pain, no numbness, no stiffness, no swelling and no tingling   Associated symptoms comment:  Also right hip pain, has been walking on it  Risk factors: no concern for non-accidental trauma        Past Medical History:  Diagnosis Date  . Asthma   . Bipolar affective (HCC)   . Hypertension   . Schizophrenia [facility])     Patient Active Problem List   Diagnosis Date Noted  . Bipolar disorder (HCC) 02/11/2021  . Schizoaffective disorder (HCC) 02/11/2021  . Schizoaffective disorder, bipolar type (HCC) 03/09/2016    Past Surgical History:  Procedure Laterality Date  . head injury    . Left knee         Family History  Problem Relation Age of Onset  . Cancer Mother   . CAD Mother     Social History   Tobacco Use  . Smoking status: Current Every Day Smoker    Packs/day: 1.00    Types: Cigarettes  . Smokeless tobacco: Never Used  Substance Use Topics  . Alcohol use: Not Currently  . Drug use: Not Currently    Types: Marijuana    Home Medications Prior to Admission medications   Medication Sig Start Date End Date Taking? Authorizing Provider  albuterol (PROVENTIL HFA;VENTOLIN HFA)  108 (90 Base) MCG/ACT inhaler Inhale 1-2 puffs into the lungs every 6 (six) hours as needed for wheezing or shortness of breath. 10/14/16   Alvina Chou, PA  amLODipine (NORVASC) 10 MG tablet Take 1 tablet (10 mg total) by mouth daily. For high blood pressure 02/16/21   Armandina Stammer I, NP  divalproex (DEPAKOTE ER) 250 MG 24 hr tablet Take 3 tablets (750 mg total) by mouth every 12 (twelve) hours. For mood stabilization 02/15/21   Armandina Stammer I, NP  hydrOXYzine (ATARAX/VISTARIL) 25 MG tablet Take 1 tablet (25 mg total) by mouth 3 (three) times daily as needed for anxiety. 02/15/21   Armandina Stammer I, NP  nicotine (NICODERM CQ - DOSED IN MG/24 HOURS) 21 mg/24hr patch Place 1 patch (21 mg total) onto the skin daily at 6 (six) AM. (May buy from over the counter): For smoking cessation 02/15/21   Armandina Stammer I, NP  risperiDONE (RISPERDAL M-TABS) 2 MG disintegrating tablet Take 1 tablet (2 mg total) by mouth 2 (two) times daily. For mood control 02/15/21   Armandina Stammer I, NP  tiZANidine (ZANAFLEX) 4 MG tablet Take 1 tablet (4 mg total) by mouth every 6 (six) hours as needed for muscle spasms. 02/15/21   Armandina Stammer I, NP  traZODone (DESYREL) 50 MG tablet Take 1  tablet (50 mg total) by mouth at bedtime as needed for sleep. 02/15/21   Armandina Stammer I, NP    Allergies    Pork-derived products and Tomato  Review of Systems   Review of Systems  Constitutional: Negative for fatigue and fever.  HENT: Negative for congestion.   Eyes: Negative for visual disturbance.  Respiratory: Negative for shortness of breath.   Cardiovascular: Negative for chest pain.  Gastrointestinal: Negative for abdominal pain.  Genitourinary: Negative for difficulty urinating.  Musculoskeletal: Positive for arthralgias. Negative for back pain, neck pain and stiffness.  Skin: Negative for rash.  Neurological: Negative for dizziness.  Psychiatric/Behavioral: Negative for agitation.  All other systems reviewed and are  negative.   Physical Exam Updated Vital Signs BP (!) 140/100 (BP Location: Left Arm)   Pulse (!) 110   Temp 98.2 F (36.8 C) (Oral)   Resp (!) 22   SpO2 93%   Physical Exam Vitals and nursing note reviewed.  Constitutional:      General: He is not in acute distress.    Appearance: Normal appearance.  HENT:     Head: Normocephalic and atraumatic.     Nose: Nose normal.  Eyes:     Conjunctiva/sclera: Conjunctivae normal.     Pupils: Pupils are equal, round, and reactive to light.  Cardiovascular:     Rate and Rhythm: Normal rate and regular rhythm.     Pulses: Normal pulses.     Heart sounds: Normal heart sounds.  Pulmonary:     Effort: Pulmonary effort is normal.     Breath sounds: Normal breath sounds.  Abdominal:     General: Abdomen is flat. Bowel sounds are normal.     Palpations: Abdomen is soft.     Tenderness: There is no abdominal tenderness. There is no guarding.  Musculoskeletal:        General: Normal range of motion.     Right shoulder: Normal.     Left shoulder: Normal.     Right upper arm: Normal.     Left upper arm: Normal.     Right elbow: Normal.     Left elbow: Normal.     Right forearm: Normal.     Left forearm: Normal.     Right wrist: Normal.     Left wrist: Normal.     Right hand: Normal.     Left hand: Normal.     Cervical back: Normal range of motion and neck supple.     Right hip: Normal. No deformity, lacerations, tenderness, bony tenderness or crepitus. Normal range of motion. Normal strength.     Left hip: Normal.     Right upper leg: Normal.     Right knee: Normal.  Skin:    General: Skin is warm and dry.     Capillary Refill: Capillary refill takes less than 2 seconds.  Neurological:     General: No focal deficit present.     Mental Status: He is alert and oriented to person, place, and time.     Deep Tendon Reflexes: Reflexes normal.  Psychiatric:        Mood and Affect: Mood normal.        Behavior: Behavior normal.      ED Results / Procedures / Treatments   Labs (all labs ordered are listed, but only abnormal results are displayed) Labs Reviewed - No data to display  EKG None  Radiology No results found.  Procedures Procedures   Medications Ordered in ED  Medications  acetaminophen (TYLENOL) tablet 1,000 mg (has no administration in time range)  ibuprofen (ADVIL) tablet 800 mg (has no administration in time range)  lidocaine (LIDODERM) 5 % 3 patch (has no administration in time range)    ED Course  I have reviewed the triage vital signs and the nursing notes.  Pertinent labs & imaging results that were available during my care of the patient were reviewed by me and considered in my medical decision making (see chart for details).   FROM of B shoulders, negative Neers test.  ROM of the right hip, no foreshortening.      George Kelley was evaluated in Emergency Department on 02/16/2021 for the symptoms described in the history of present illness. He was evaluated in the context of the global COVID-19 pandemic, which necessitated consideration that the patient might be at risk for infection with the SARS-CoV-2 virus that causes COVID-19. Institutional protocols and algorithms that pertain to the evaluation of patients at risk for COVID-19 are in a state of rapid change based on information released by regulatory bodies including the CDC and federal and state organizations. These policies and algorithms were followed during the patient's care in the ED.  Final Clinical Impression(s) / ED Diagnoses  Eloped during treatment process   Doshia Dalia, MD 02/16/21 2357

## 2021-02-16 NOTE — ED Notes (Signed)
Attempted to enter room to medicate pt. Upon entering pt sain "Nah, fuck this shit, Im gone" and walked out past this RN. I asked him if he was leaving and he said "yeah Im leaving, I dont like how that bitch doctor spoke to me, fuck that". Pt left the department with a steady gait, NADN, respirations even and unlabored. MD made aware.
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
## 2021-04-30 ENCOUNTER — Other Ambulatory Visit: Payer: Self-pay

## 2021-04-30 ENCOUNTER — Ambulatory Visit (HOSPITAL_COMMUNITY): Payer: 59 | Admitting: Physician Assistant

## 2021-04-30 ENCOUNTER — Encounter (HOSPITAL_COMMUNITY): Payer: Self-pay | Admitting: Physician Assistant

## 2021-04-30 ENCOUNTER — Ambulatory Visit (INDEPENDENT_AMBULATORY_CARE_PROVIDER_SITE_OTHER): Payer: 59 | Admitting: Physician Assistant

## 2021-04-30 VITALS — BP 124/103 | HR 83 | Ht 77.0 in | Wt 240.0 lb

## 2021-04-30 DIAGNOSIS — F5105 Insomnia due to other mental disorder: Secondary | ICD-10-CM | POA: Diagnosis not present

## 2021-04-30 DIAGNOSIS — F99 Mental disorder, not otherwise specified: Secondary | ICD-10-CM

## 2021-04-30 DIAGNOSIS — F250 Schizoaffective disorder, bipolar type: Secondary | ICD-10-CM

## 2021-04-30 DIAGNOSIS — F411 Generalized anxiety disorder: Secondary | ICD-10-CM | POA: Diagnosis not present

## 2021-04-30 MED ORDER — DIVALPROEX SODIUM ER 250 MG PO TB24: 750.0000 mg | ORAL_TABLET | Freq: Two times a day (BID) | ORAL | 1 refills | Status: AC

## 2021-04-30 MED ORDER — RISPERIDONE 2 MG PO TBDP: 2.0000 mg | ORAL_TABLET | Freq: Two times a day (BID) | ORAL | 1 refills | Status: AC

## 2021-04-30 MED ORDER — HYDROXYZINE HCL 25 MG PO TABS: 25.0000 mg | ORAL_TABLET | Freq: Three times a day (TID) | ORAL | 1 refills | Status: AC | PRN
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
## 2021-05-01 ENCOUNTER — Other Ambulatory Visit: Payer: Self-pay | Admitting: Critical Care Medicine

## 2021-05-01 NOTE — Telephone Encounter (Signed)
Medication Refill - Medication: Trazodone, Amlodipine   Has the patient contacted their pharmacy? Yes.   Pt states that he is needing to have this refilled and that he saw Dr. Delford Field at the behavioral health center. Please advise. (Agent: If no, request that the patient contact the pharmacy for the refill.) (Agent: If yes, when and what did the pharmacy advise?)  Preferred Pharmacy (with phone number or street name):  Agent: Please be ad [id number] - [place], [place] - [address] AT [facility] [address]  [address] [place] [address]  Phone: [phone] Fax: [phone]  Hours: Open 24 hours  vised that RX refills may take up to 3 business days. We ask that you follow-up with your pharmacy.

## 2021-05-01 NOTE — Telephone Encounter (Signed)
  Notes to clinic:  Patient is requesting refills from Dr. Delford Field   Requested Prescriptions  Pending Prescriptions Disp Refills   traZODone (DESYREL) 50 MG tablet 30 tablet 0    Sig: Take 1 tablet (50 mg total) by mouth at bedtime as needed for sleep.      Psychiatry: Antidepressants - Serotonin Modulator Failed - 05/01/2021 11:19 AM      Failed - Valid encounter within last 6 months    Recent Outpatient Visits   None                amLODipine (NORVASC) 10 MG tablet 30 tablet 0    Sig: Take 1 tablet (10 mg total) by mouth daily. For high blood pressure      Cardiovascular:  Calcium Channel Blockers Failed - 05/01/2021 11:19 AM      Failed - Last BP in normal range    BP Readings from Last 1 Encounters:  02/16/21 (!) 140/100          Failed - Valid encounter within last 6 months    Recent Outpatient Visits   None

## 2021-05-03 DIAGNOSIS — F99 Mental disorder, not otherwise specified: Secondary | ICD-10-CM | POA: Insufficient documentation

## 2021-05-03 DIAGNOSIS — F5105 Insomnia due to other mental disorder: Secondary | ICD-10-CM | POA: Insufficient documentation

## 2021-05-03 MED ORDER — TRAZODONE HCL 50 MG PO TABS: 50.0000 mg | ORAL_TABLET | Freq: Every evening | ORAL | 1 refills | Status: AC | PRN

## 2021-06-24 ENCOUNTER — Ambulatory Visit (HOSPITAL_COMMUNITY): Payer: 59 | Admitting: Physician Assistant

## 2022-06-21 IMAGING — DX DG ELBOW COMPLETE 3+V*L*
4 series · 4 of 4 positions shown · non-contrast
Comparison: None.

CLINICAL DATA: Pain.

EXAM:
LEFT ELBOW - COMPLETE 3+ VIEW

[elbow ap]
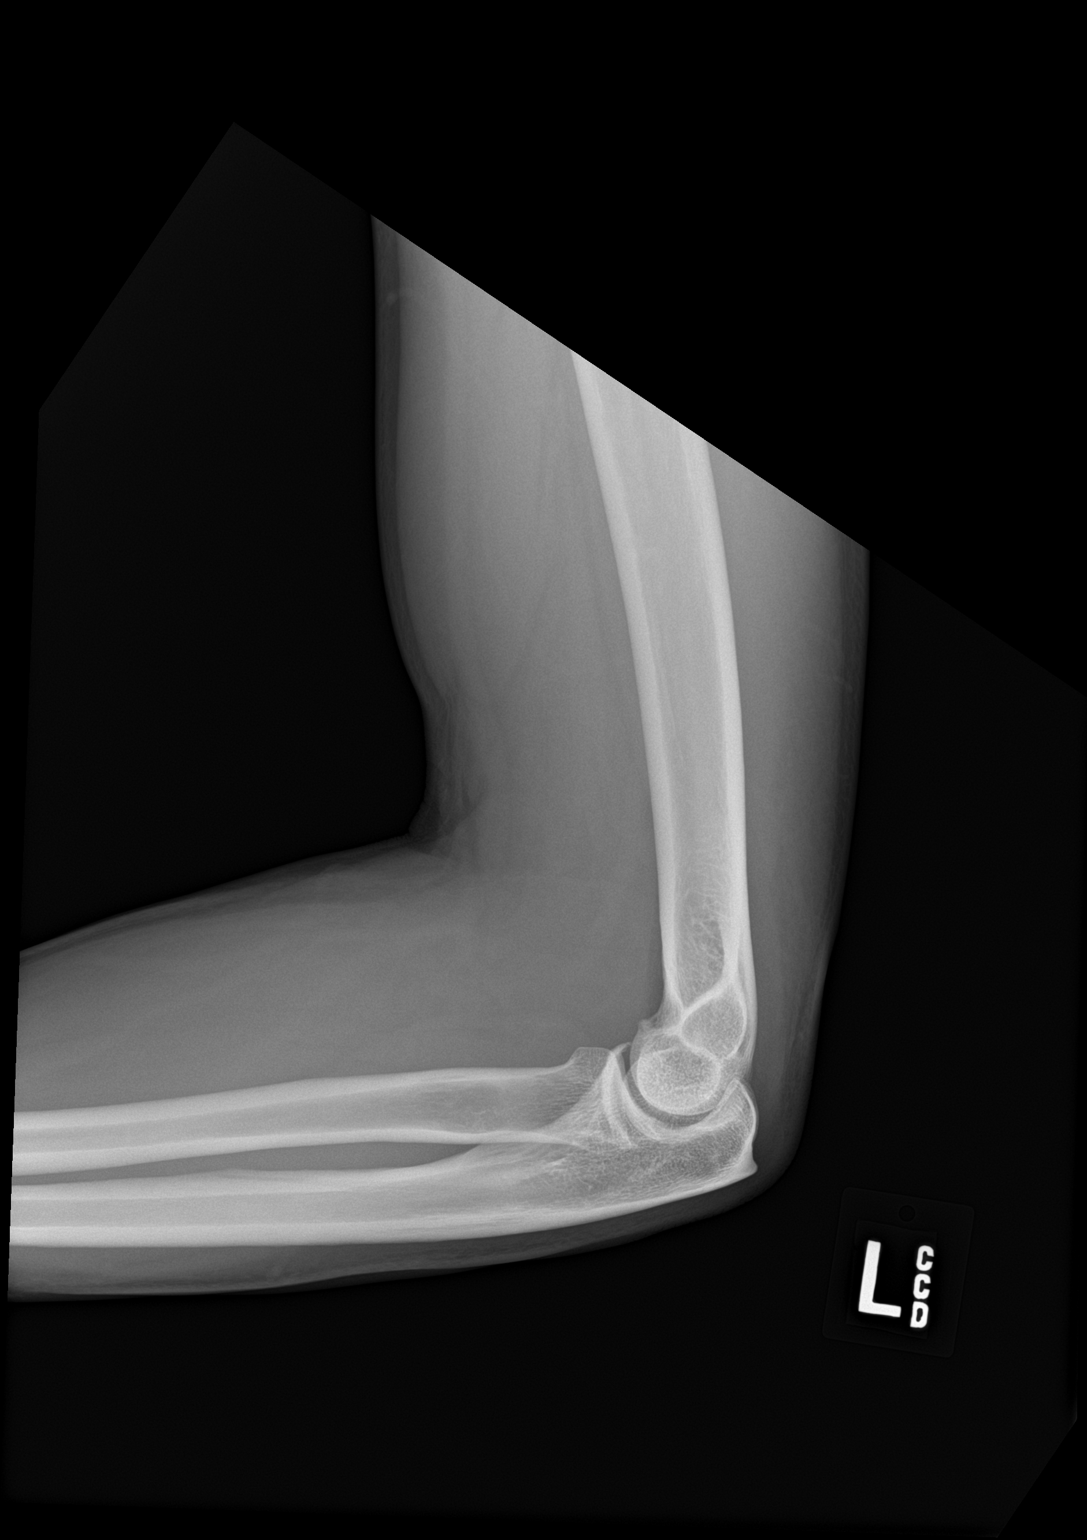

[elbow obl (1 of 2)]
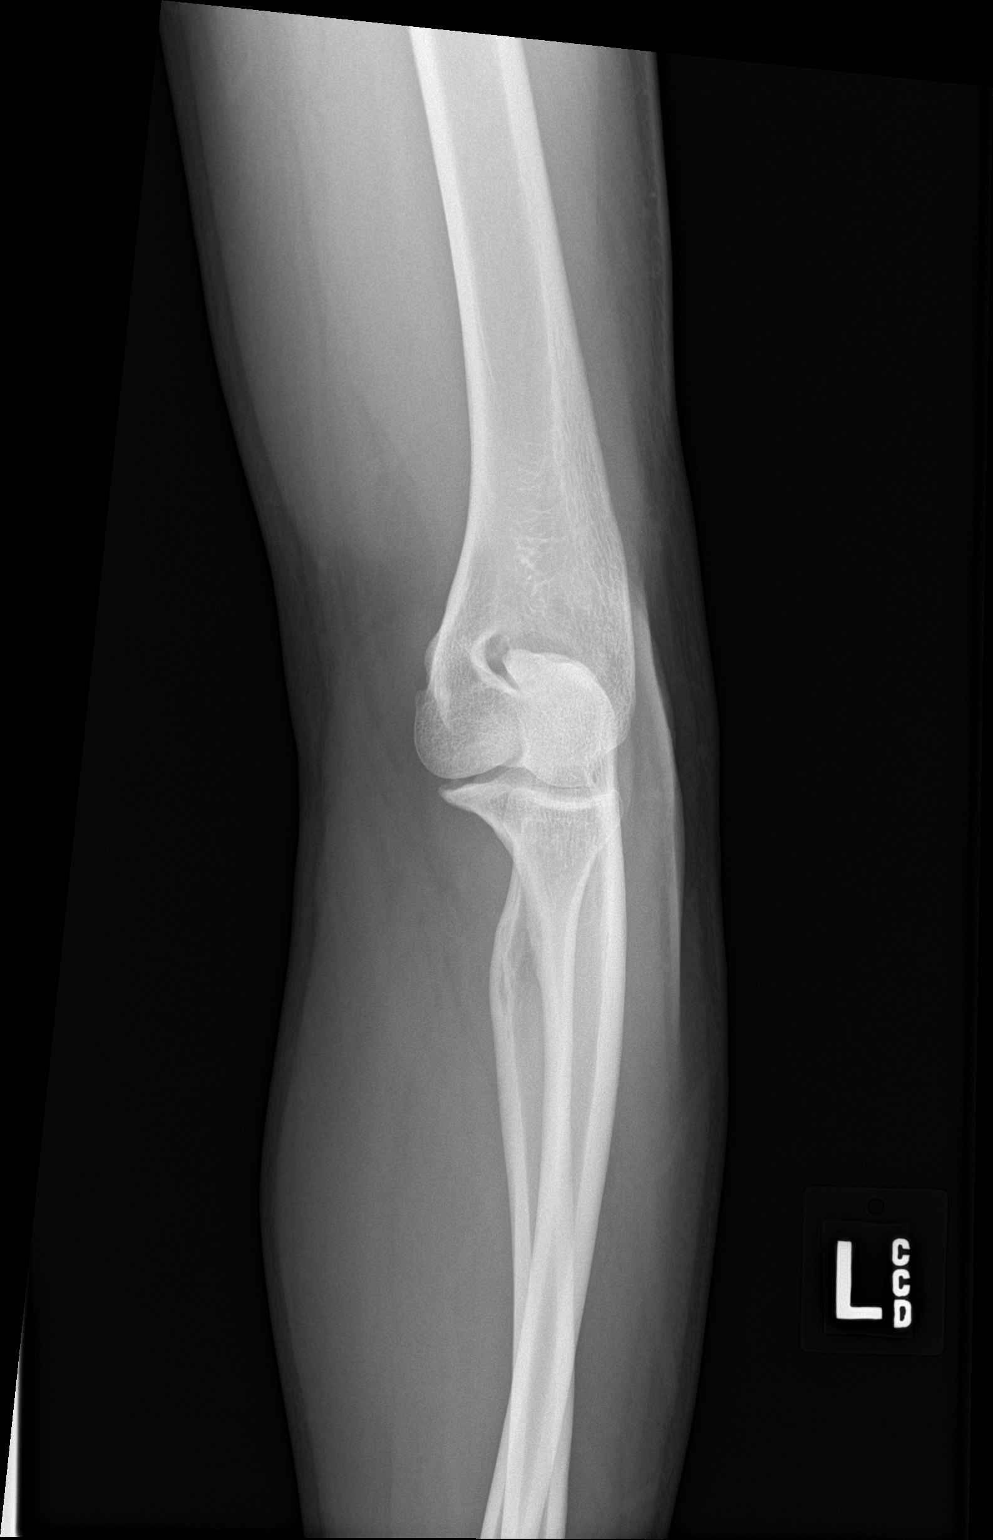

[elbow obl (2 of 2)]
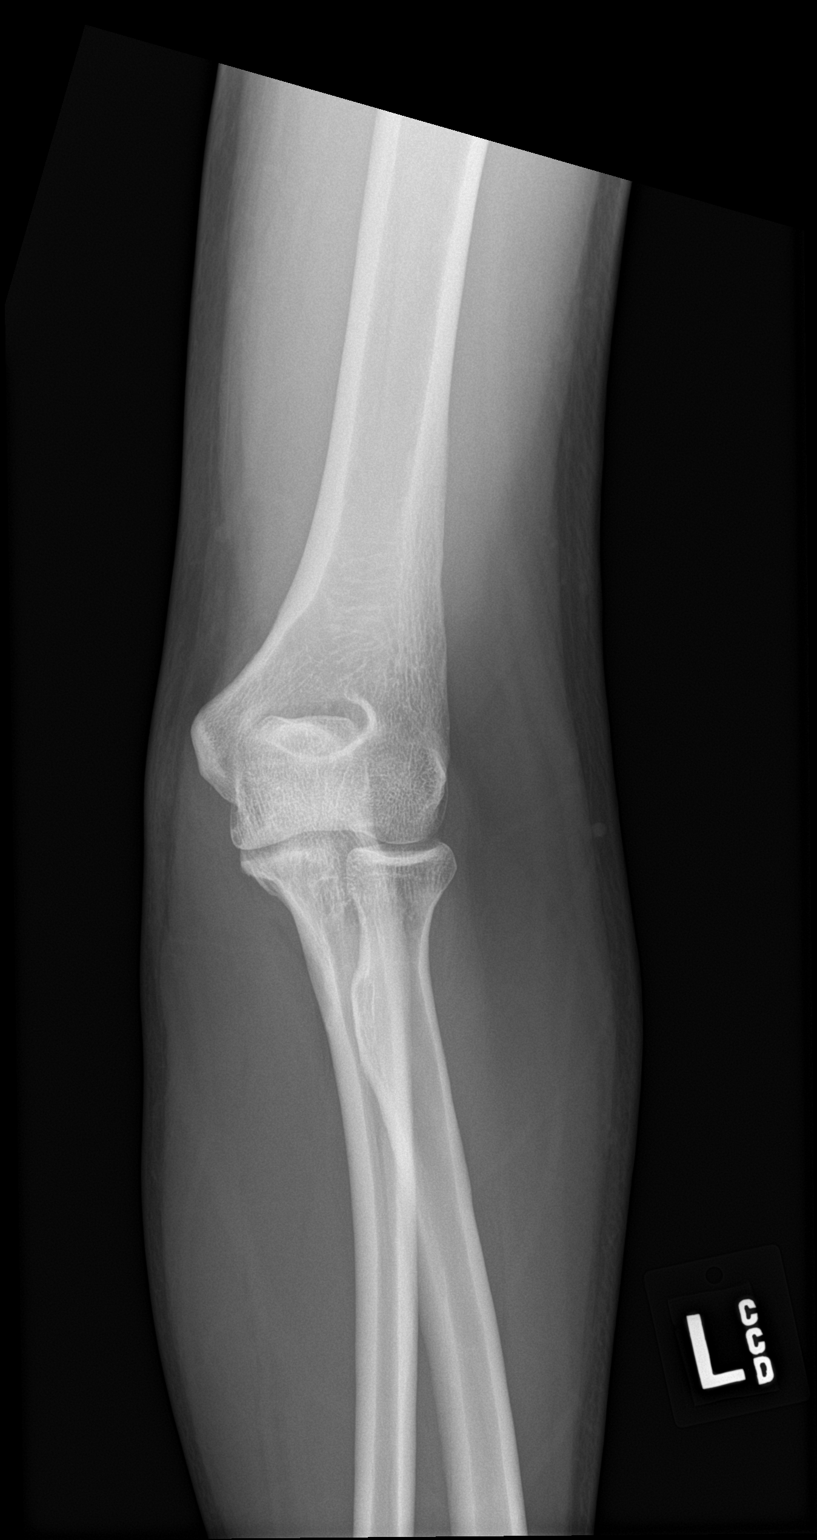

[elbow lat]
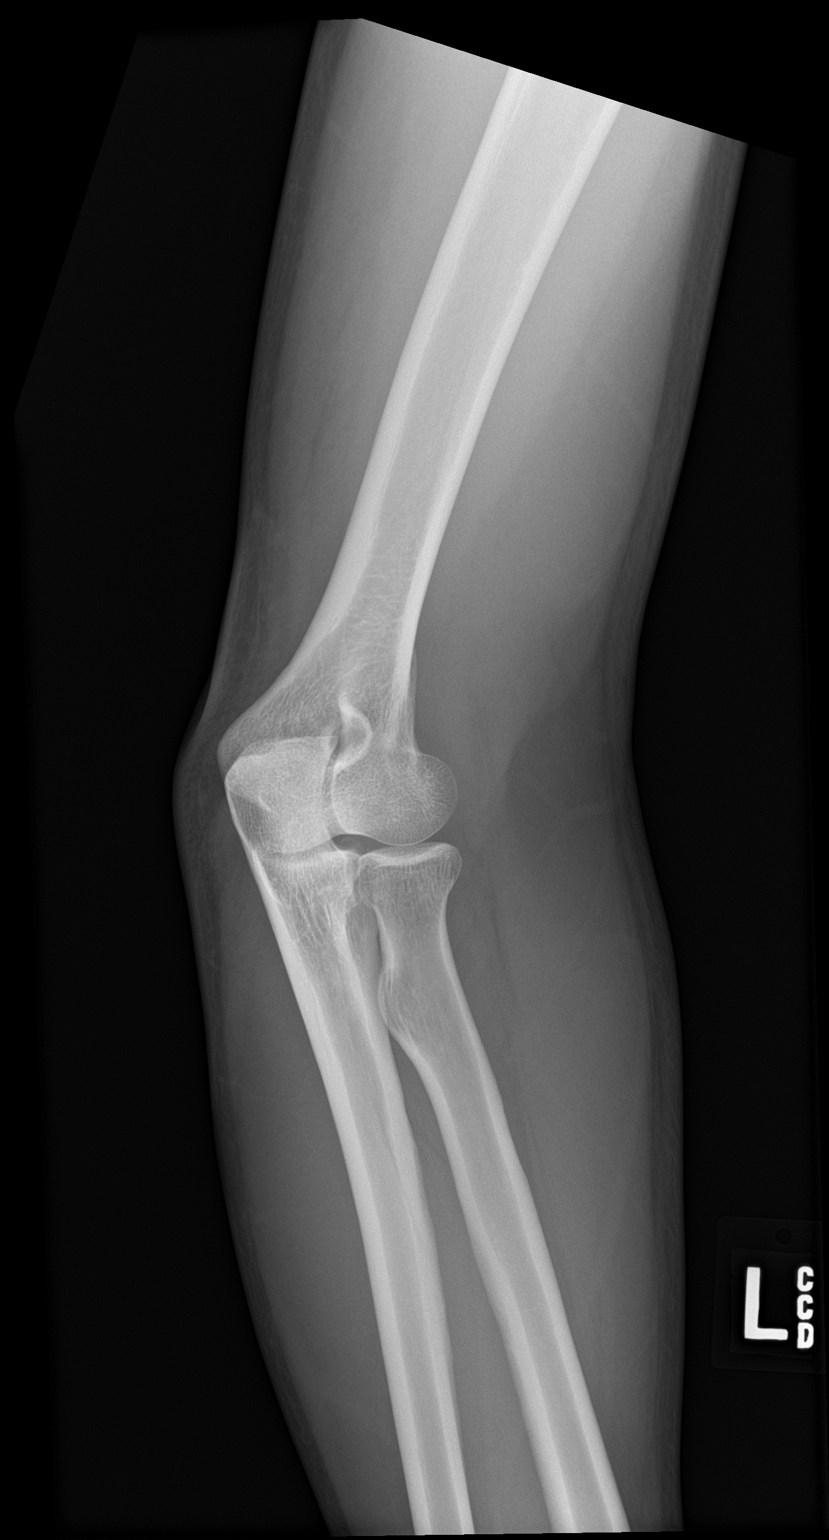

[4 of 4 positions shown; findings below may reference images not displayed]

FINDINGS: There is no evidence of fracture, dislocation, or joint effusion.
There is no evidence of arthropathy or other focal bone abnormality.
Soft tissues are unremarkable.
IMPRESSION: Negative.

## 2022-07-16 ENCOUNTER — Ambulatory Visit (HOSPITAL_COMMUNITY)
Admission: EM | Admit: 2022-07-16 | Discharge: 2022-07-16 | Disposition: A | Discharge status: Home / Self Care | Payer: Medicare Other | Attending: Physician Assistant | Admitting: Physician Assistant

## 2022-07-16 ENCOUNTER — Ambulatory Visit (INDEPENDENT_AMBULATORY_CARE_PROVIDER_SITE_OTHER): Payer: Medicare Other

## 2022-07-16 ENCOUNTER — Encounter (HOSPITAL_COMMUNITY): Payer: Self-pay

## 2022-07-16 DIAGNOSIS — S93402A Sprain of unspecified ligament of left ankle, initial encounter: Secondary | ICD-10-CM

## 2022-07-16 DIAGNOSIS — M25572 Pain in left ankle and joints of left foot: Secondary | ICD-10-CM | POA: Diagnosis not present

## 2022-07-16 MED ORDER — NAPROXEN 500 MG PO TABS: 500.0000 mg | ORAL_TABLET | Freq: Two times a day (BID) | ORAL | 0 refills | Status: AC

## 2022-07-16 NOTE — ED Triage Notes (Signed)
Pt complains of pain on the left ankle x 3 months . Causing pain and discomfort when walking .

## 2022-07-16 NOTE — Discharge Instructions (Signed)
Take Naprosyn twice daily for pain and inflammation.  Do not take NSAIDs including aspirin, ibuprofen/Advil, naproxen/Aleve with this medication.  Use the brace for comfort and support.  Keep your foot elevated and use ice.  Use crutches if you are having difficulty bearing weight.  I do recommend you follow-up with a podiatrist.  Please call to schedule an appointment.  If anything worsens please return for reevaluation.
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
## 2022-07-20 ENCOUNTER — Other Ambulatory Visit (HOSPITAL_COMMUNITY): Payer: Self-pay | Admitting: Physician Assistant

## 2022-07-20 DIAGNOSIS — F5105 Insomnia due to other mental disorder: Secondary | ICD-10-CM

## 2022-07-29 ENCOUNTER — Ambulatory Visit (INDEPENDENT_AMBULATORY_CARE_PROVIDER_SITE_OTHER): Payer: Medicare Other | Admitting: Podiatry

## 2022-07-29 ENCOUNTER — Ambulatory Visit (INDEPENDENT_AMBULATORY_CARE_PROVIDER_SITE_OTHER): Payer: Medicare Other

## 2022-07-29 DIAGNOSIS — M7752 Other enthesopathy of left foot: Secondary | ICD-10-CM | POA: Diagnosis not present

## 2022-07-29 MED ORDER — METHYLPREDNISOLONE 4 MG PO TBPK: ORAL_TABLET | ORAL | 0 refills | Status: DC

## 2022-07-29 NOTE — Progress Notes (Signed)
ROS: Subjective:  Patient ID: George Kelley, male    DOB: Sep 05, 1981,  MRN: [id number] HPI Chief Complaint  Patient presents with   Ankle Injury    Left ankle pain/sprain     41 y.o. male presents with the above complaint.   Denies fever chills nausea vomiting muscle aches pains calf pain back pain chest pain shortness of breath.  Past Medical History:  Diagnosis Date   Asthma    Bipolar affective ([place])    Hypertension    Schizophrenia ([place])    Past Surgical History:  Procedure Laterality Date   head injury     Left knee      Current Outpatient Medications:    methylPREDNISolone (MEDROL DOSEPAK) 4 MG TBPK tablet, 6 day dose pack - take as directed, Disp: 21 tablet, Rfl: 0   albuterol (PROVENTIL HFA;VENTOLIN HFA) 108 (90 Base) MCG/ACT inhaler, Inhale 1-2 puffs into the lungs every 6 (six) hours as needed for wheezing or shortness of breath., Disp: 1 Inhaler, Rfl: 0   amLODipine (NORVASC) 10 MG tablet, Take 1 tablet (10 mg total) by mouth daily. For high blood pressure, Disp: 30 tablet, Rfl: 0   divalproex (DEPAKOTE ER) 250 MG 24 hr tablet, Take 3 tablets (750 mg total) by mouth every 12 (twelve) hours. For mood stabilization, Disp: 180 tablet, Rfl: 1   hydrOXYzine (ATARAX/VISTARIL) 25 MG tablet, Take 1 tablet (25 mg total) by mouth 3 (three) times daily as needed for anxiety., Disp: 75 tablet, Rfl: 1   naproxen (NAPROSYN) 500 MG tablet, Take 1 tablet (500 mg total) by mouth 2 (two) times daily., Disp: 30 tablet, Rfl: 0   nicotine (NICODERM CQ - DOSED IN MG/24 HOURS) 21 mg/24hr patch, Place 1 patch (21 mg total) onto the skin daily at 6 (six) AM. (May buy from over the counter): For smoking cessation, Disp: 1 patch, Rfl: 0   risperiDONE (RISPERDAL M-TABS) 2 MG disintegrating tablet, Take 1 tablet (2 mg total) by mouth 2 (two) times daily. For mood control, Disp: 60 tablet, Rfl: 1   tiZANidine (ZANAFLEX) 4 MG tablet, Take 1 tablet (4 mg total) by mouth every 6 (six) hours as needed  for muscle spasms., Disp: 30 tablet, Rfl: 0   traZODone (DESYREL) 50 MG tablet, Take 1 tablet (50 mg total) by mouth at bedtime as needed for sleep., Disp: 30 tablet, Rfl: 1  Allergies  Allergen Reactions   Pork-Derived Products     Pt states he doesn't Eat pork at all due to religious preference    Tomato Swelling   Review of Systems Objective:  There were no vitals filed for this visit.  General: Well developed, nourished, in no acute distress, alert and oriented x3   Dermatological: Skin is warm, dry and supple bilateral. Nails x 10 are well maintained; remaining integument appears unremarkable at this time. There are no open sores, no preulcerative lesions, no rash or signs of infection present.  Vascular: Dorsalis Pedis artery and Posterior Tibial artery pedal pulses are 2/4 bilateral with immedate capillary fill time. Pedal hair growth present. No varicosities and no lower extremity edema present bilateral.   Neruologic: Grossly intact via light touch bilateral. Vibratory intact via tuning fork bilateral. Protective threshold with Semmes Wienstein monofilament intact to all pedal sites bilateral. Patellar and Achilles deep tendon reflexes 2+ bilateral. No Babinski or clonus noted bilateral.   Musculoskeletal: No gross boney pedal deformities bilateral. No pain, crepitus, or limitation noted with foot and ankle range of motion bilateral. Muscular  strength 5/5 in all groups tested bilateral.  Pain on palpation and range of motion of the subtalar joint of the left foot with pain on palpation of the sinus tarsi.  Gait: Unassisted, Nonantalgic.    Radiographs:  Radiographs taken today do not demonstrate any significant osseous abnormalities of the foot.  Mild pes planovalgus is noted.  Assessment & Plan:   Assessment: Subtalar joint capsulitis left.  Plan: Discussed etiology pathology conservative surgical therapies discussed injecting subtalar joint today which we did he tolerated  this procedure well start him on a Medrol Dosepak to be followed by meloxicam.  We will follow-up with him in 1 month     Taleen Prosser T. [employer], [place]

## 2022-08-31 ENCOUNTER — Encounter: Payer: Self-pay | Admitting: Podiatry

## 2022-08-31 ENCOUNTER — Ambulatory Visit (INDEPENDENT_AMBULATORY_CARE_PROVIDER_SITE_OTHER): Payer: Medicare Other | Admitting: Podiatry

## 2022-08-31 DIAGNOSIS — M7752 Other enthesopathy of left foot: Secondary | ICD-10-CM | POA: Diagnosis not present

## 2022-08-31 MED ORDER — METHYLPREDNISOLONE 4 MG PO TBPK: ORAL_TABLET | ORAL | 0 refills | Status: DC

## 2022-08-31 MED ORDER — MELOXICAM 15 MG PO TABS: 15.0000 mg | ORAL_TABLET | Freq: Every day | ORAL | 3 refills | Status: AC

## 2022-08-31 MED ORDER — TRIAMCINOLONE ACETONIDE 40 MG/ML IJ SUSP
20.0000 mg | Freq: Once | INTRAMUSCULAR | Status: AC
  Administered 2022-08-31: 20 mg

## 2022-08-31 NOTE — Progress Notes (Signed)
He presents today for follow-up of subtalar joint capsulitis left.  States that it is doing better I never picked up the medicine so he never started taking either medication.  States that he was doing really well and has started to sneak back on me at this point.  Objective: Vital signs stable alert oriented x3 still has pain on palpation to the subtalar joint left on end range of motion and on palpation of the sinus tarsi.  Assessment: Subtalar joint capsulitis secondary most likely due to some mild pes planus.  Assessment: Pes planovalgus capsulitis subtalar joint left.  Plan: Reinjected the area today 20 mg Kenalog 5 mg Marcaine point of maximal tenderness.  Tolerated procedure well after l the area was cleaned with some antiseptic.  We sent over electronically methylprednisolone and meloxicam.  Encouraged him to get it and take at this time.  I will follow-up with him in about a month to 6 weeks.

## 2022-10-09 IMAGING — CR DG HAND COMPLETE 3+V*R*
3 series · 3 of 3 positions shown · non-contrast
Comparison: 10/20/2017.

CLINICAL DATA: Injury.

EXAM:
RIGHT HAND - COMPLETE 3+ VIEW

[x hand pa right]
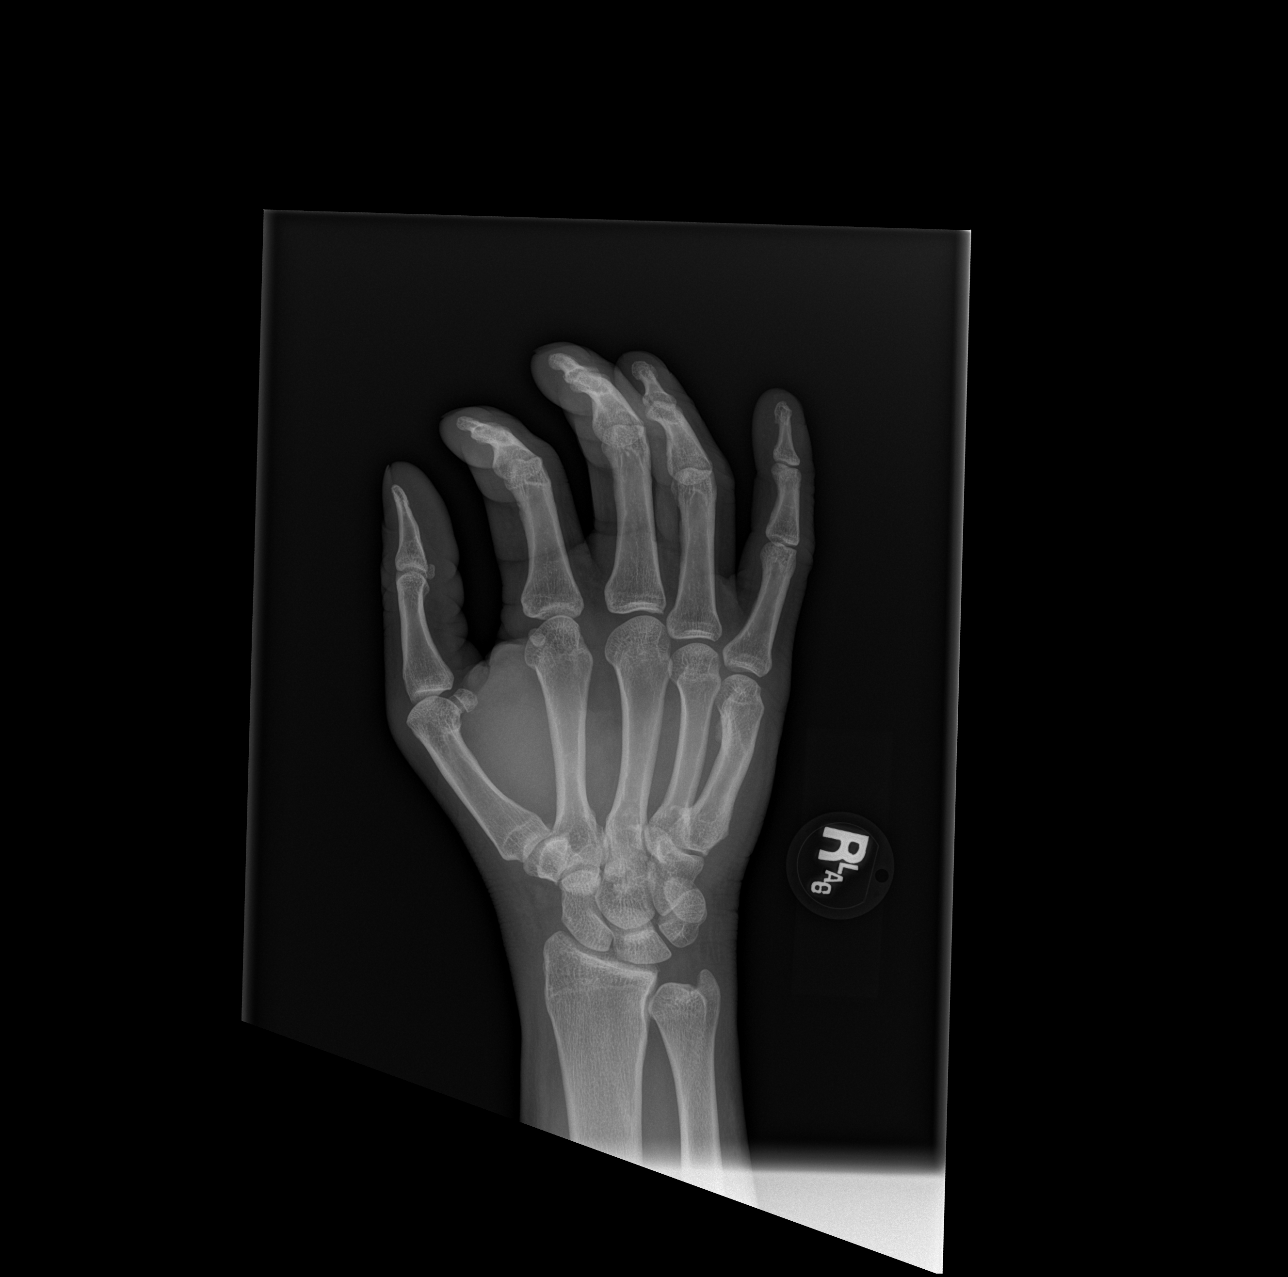

[x hand obl right]
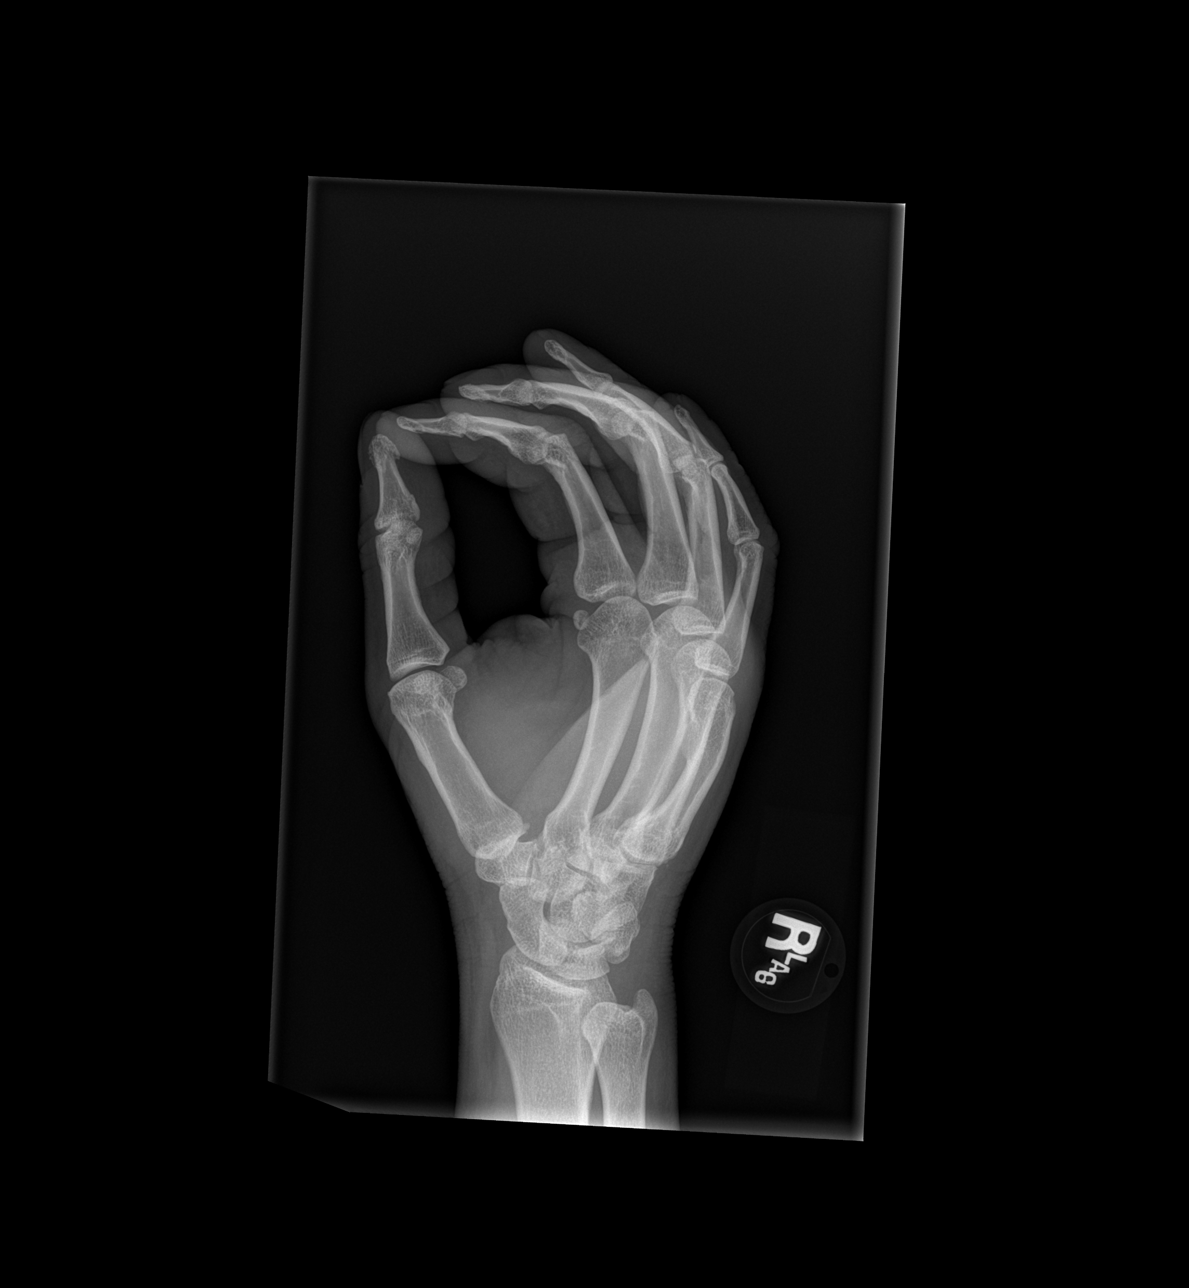

[x hand lat right]
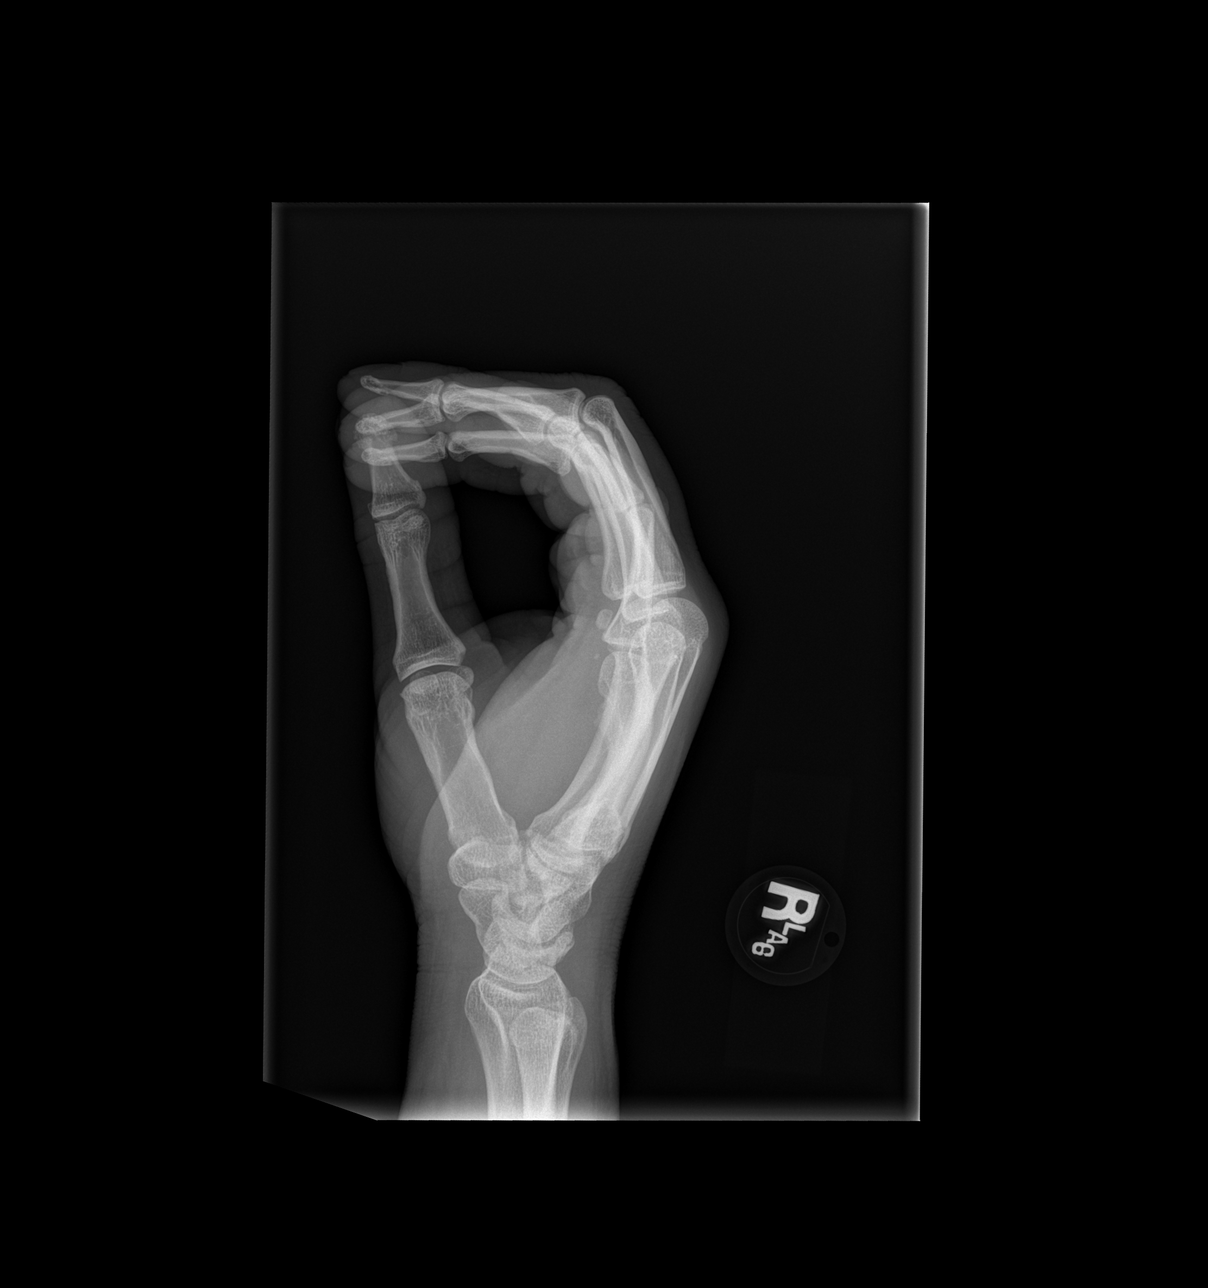

[3 of 3 positions shown; findings below may reference images not displayed]

FINDINGS: Old healed fracture right fifth meta carpal again noted. No interim
change. No acute abnormality identified.
IMPRESSION: Old healed fracture right fifth metacarpal again noted. No interim
change.

## 2022-10-14 ENCOUNTER — Ambulatory Visit: Payer: Medicare Other | Admitting: Podiatry

## 2022-11-11 ENCOUNTER — Ambulatory Visit (INDEPENDENT_AMBULATORY_CARE_PROVIDER_SITE_OTHER): Payer: 59 | Admitting: Podiatry

## 2022-11-11 DIAGNOSIS — M7752 Other enthesopathy of left foot: Secondary | ICD-10-CM

## 2022-11-11 MED ORDER — TRIAMCINOLONE ACETONIDE 40 MG/ML IJ SUSP
20.0000 mg | Freq: Once | INTRAMUSCULAR | Status: AC
  Administered 2022-11-11: 20 mg

## 2022-11-11 NOTE — Progress Notes (Signed)
He presents today chief complaint of recurrence of some pain to his left subtalar joint.  He states that he has been doing fine until the cold weather gets states that when he takes his meloxicam he does very well with it.  Objective: Vital signs stable he is alert and oriented x 3 there is no erythema edema cellulitis drainage or odor he has pain on end range of motion of the subtalar joint left.  Assessment: Subtalar joint capsulitis.  Plan: Continues to wear his Tri-Lock brace and we reinjected subtalar joint today after Betadine skin prep to 20 mg Kenalog 5 mg Marcaine point maximal tenderness left foot.  Follow-up with him on a as needed basis.

## 2023-01-04 ENCOUNTER — Ambulatory Visit (INDEPENDENT_AMBULATORY_CARE_PROVIDER_SITE_OTHER): Payer: 59 | Admitting: Podiatry

## 2023-01-04 ENCOUNTER — Encounter: Payer: Self-pay | Admitting: Podiatry

## 2023-01-04 DIAGNOSIS — M7752 Other enthesopathy of left foot: Secondary | ICD-10-CM | POA: Diagnosis not present

## 2023-01-04 MED ORDER — MELOXICAM 15 MG PO TABS: 15.0000 mg | ORAL_TABLET | Freq: Every day | ORAL | 3 refills | Status: AC

## 2023-01-04 MED ORDER — METHYLPREDNISOLONE 4 MG PO TBPK: ORAL_TABLET | ORAL | 0 refills | Status: AC

## 2023-01-04 MED ORDER — TRIAMCINOLONE ACETONIDE 40 MG/ML IJ SUSP
20.0000 mg | Freq: Once | INTRAMUSCULAR | Status: AC
  Administered 2023-01-04: 20 mg

## 2023-01-04 NOTE — Progress Notes (Signed)
George Kelley presents today for follow-up of his capsulitis of his left ankle he states that is about 75% better he is really happy with the outcome.  Objective: Vital signs are stable alert oriented x 3.  Pulses are palpable.  There is some tenderness on palpation and on end range of motion of the subtalar joint of the left foot.  Assessment: Subtalar joint capsulitis resolving.  Plan: We injected the area today 20 mg of dexamethasone and local anesthetic to the point of maximal tenderness after sterile Betadine skin prep.  He tolerated procedure well without complications.  Refilled his methylprednisolone and his meloxicam today.

## 2023-03-08 ENCOUNTER — Telehealth: Payer: Self-pay | Admitting: Podiatry

## 2023-03-08 ENCOUNTER — Ambulatory Visit: Payer: 59 | Admitting: Podiatry

## 2023-03-08 NOTE — Telephone Encounter (Signed)
Pt called and r/s his appt for today his transportation put the appt at 915pm not am. He is rescheduled but asked about his medication if it could be refilled( meloxicam). Pt states he has no way to get to the pharmacy but will find one today. He says he has called the pharmacy multiple times and cannot get anyone to answer or return his call. The pharmacy is correct in the chart.   I looked and it appeared to have 3 refills left on the last one but told him I would make sure. Please advise

## 2023-03-09 NOTE — Telephone Encounter (Signed)
Left message for pt that there are refills on the meloxicam and to call if he has any issues getting the medication.

## 2023-03-24 ENCOUNTER — Encounter: Payer: Self-pay | Admitting: Podiatry

## 2023-03-24 ENCOUNTER — Ambulatory Visit (INDEPENDENT_AMBULATORY_CARE_PROVIDER_SITE_OTHER): Payer: 59 | Admitting: Podiatry

## 2023-03-24 DIAGNOSIS — M25572 Pain in left ankle and joints of left foot: Secondary | ICD-10-CM | POA: Diagnosis not present

## 2023-03-24 DIAGNOSIS — M7752 Other enthesopathy of left foot: Secondary | ICD-10-CM

## 2023-03-24 DIAGNOSIS — G8929 Other chronic pain: Secondary | ICD-10-CM

## 2023-03-24 MED ORDER — MELOXICAM 15 MG PO TABS: 15.0000 mg | ORAL_TABLET | Freq: Every day | ORAL | 3 refills | Status: AC

## 2023-03-24 NOTE — Progress Notes (Signed)
He presents today for follow-up of his subtalar joint capsulitis and ankle joint pain.  Was from initial injury from afternoon.  Continues to wear his brace on a regular basis takes his anti-inflammatories regularly and states that the injections do seem to help but for only a short period of time.  States that he was running the other day did pretty well with a fast walk jog but when he started running it really collapsed and become very painful.  Objective: Vital signs stable he is alert and oriented x 3.  There is no erythema edema cellulitis drainage or.  He has pain on palpation of the sinus tarsi.  He has pain on end range of motion of the subtalar joint and of the anterolateral ankle joint.  There is no crepitation cannot rule out an OCD at this point.  Assessment subtalar joint capsulitis ankle joint capsulitis cannot rule out a tear or osteochondral defect.  Plan: Conservative therapies have failed to alleviate this patient's symptomatology we will go to request an MRI for joint and tendon evaluation around the ankle and the subtalar joint left foot.  I refilled his anti-inflammatory for him today meloxicam 15 mg he will take 1 pill daily.

## 2023-03-29 ENCOUNTER — Ambulatory Visit
Admission: RE | Admit: 2023-03-29 | Discharge: 2023-03-29 | Disposition: A | Discharge status: Home / Self Care | Payer: 59 | Source: Ambulatory Visit | Attending: Podiatry | Admitting: Podiatry

## 2023-03-29 DIAGNOSIS — S93492A Sprain of other ligament of left ankle, initial encounter: Secondary | ICD-10-CM | POA: Diagnosis not present

## 2023-03-29 DIAGNOSIS — M7752 Other enthesopathy of left foot: Secondary | ICD-10-CM

## 2023-03-29 DIAGNOSIS — G8929 Other chronic pain: Secondary | ICD-10-CM

## 2023-03-29 DIAGNOSIS — S86312A Strain of muscle(s) and tendon(s) of peroneal muscle group at lower leg level, left leg, initial encounter: Secondary | ICD-10-CM | POA: Diagnosis not present

## 2023-04-03 ENCOUNTER — Other Ambulatory Visit: Payer: 59

## 2023-08-23 DIAGNOSIS — Z720 Tobacco use: Secondary | ICD-10-CM | POA: Diagnosis not present

## 2023-08-23 DIAGNOSIS — Z131 Encounter for screening for diabetes mellitus: Secondary | ICD-10-CM | POA: Diagnosis not present

## 2023-08-23 DIAGNOSIS — I10 Essential (primary) hypertension: Secondary | ICD-10-CM | POA: Diagnosis not present

## 2023-08-23 DIAGNOSIS — J4520 Mild intermittent asthma, uncomplicated: Secondary | ICD-10-CM | POA: Diagnosis not present

## 2023-09-06 DIAGNOSIS — Z720 Tobacco use: Secondary | ICD-10-CM | POA: Diagnosis not present

## 2023-09-06 DIAGNOSIS — R7303 Prediabetes: Secondary | ICD-10-CM | POA: Diagnosis not present

## 2023-09-06 DIAGNOSIS — I10 Essential (primary) hypertension: Secondary | ICD-10-CM | POA: Diagnosis not present

## 2023-09-06 DIAGNOSIS — J4520 Mild intermittent asthma, uncomplicated: Secondary | ICD-10-CM | POA: Diagnosis not present

## 2023-09-28 ENCOUNTER — Ambulatory Visit: Payer: 59 | Admitting: Family Medicine

## 2023-11-14 ENCOUNTER — Telehealth (HOSPITAL_COMMUNITY): Payer: Self-pay
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
## 2023-11-23 ENCOUNTER — Telehealth (HOSPITAL_COMMUNITY): Payer: Self-pay | Admitting: *Deleted

## 2023-11-23 NOTE — Telephone Encounter (Signed)
George Kelley LVM on nurse line stating that he wanted "a male to connect with; talk to". Pt has been seen at [facility] but has no future appointments.

## 2023-11-25 ENCOUNTER — Other Ambulatory Visit: Payer: Self-pay

## 2023-11-25 ENCOUNTER — Emergency Department (HOSPITAL_COMMUNITY)
Admission: EM | Admit: 2023-11-25 | Discharge: 2023-11-25 | Disposition: A | Discharge status: Home / Self Care | Payer: 59 | Attending: Emergency Medicine | Admitting: Emergency Medicine

## 2023-11-25 DIAGNOSIS — F419 Anxiety disorder, unspecified: Secondary | ICD-10-CM | POA: Diagnosis present

## 2023-11-25 DIAGNOSIS — F4310 Post-traumatic stress disorder, unspecified: Secondary | ICD-10-CM | POA: Insufficient documentation

## 2023-11-25 DIAGNOSIS — Z5321 Procedure and treatment not carried out due to patient leaving prior to being seen by health care provider: Secondary | ICD-10-CM

## 2023-11-25 DIAGNOSIS — Z5329 Procedure and treatment not carried out because of patient's decision for other reasons: Secondary | ICD-10-CM | POA: Insufficient documentation

## 2023-11-25 LAB — CBC WITH DIFFERENTIAL/PLATELET
Abs Immature Granulocytes: 0.01 10*3/uL (ref 0.00–0.07)
Basophils Absolute: 0 10*3/uL (ref 0.0–0.1)
Basophils Relative: 1 %
Eosinophils Absolute: 0.1 10*3/uL (ref 0.0–0.5)
Eosinophils Relative: 1 %
HCT: 48.3 % (ref 39.0–52.0)
Hemoglobin: 15.9 g/dL (ref 13.0–17.0)
Immature Granulocytes: 0 %
Lymphocytes Relative: 49 %
Lymphs Abs: 3.4 10*3/uL (ref 0.7–4.0)
MCH: 34.1 pg — ABNORMAL HIGH (ref 26.0–34.0)
MCHC: 32.9 g/dL (ref 30.0–36.0)
MCV: 103.6 fL — ABNORMAL HIGH (ref 80.0–100.0)
Monocytes Absolute: 0.5 10*3/uL (ref 0.1–1.0)
Monocytes Relative: 7 %
Neutro Abs: 2.9 10*3/uL (ref 1.7–7.7)
Neutrophils Relative %: 42 %
Platelets: 219 10*3/uL (ref 150–400)
RBC: 4.66 MIL/uL (ref 4.22–5.81)
RDW: 11.9 % (ref 11.5–15.5)
WBC: 6.9 10*3/uL (ref 4.0–10.5)
nRBC: 0 % (ref 0.0–0.2)

## 2023-11-25 LAB — COMPREHENSIVE METABOLIC PANEL
ALT: 22 U/L (ref 0–44)
AST: 26 U/L (ref 15–41)
Albumin: 4.5 g/dL (ref 3.5–5.0)
Alkaline Phosphatase: 63 U/L (ref 38–126)
Anion gap: 12 (ref 5–15)
BUN: 21 mg/dL — ABNORMAL HIGH (ref 6–20)
CO2: 23 mmol/L (ref 22–32)
Calcium: 9.6 mg/dL (ref 8.9–10.3)
Chloride: 102 mmol/L (ref 98–111)
Creatinine, Ser: 1.23 mg/dL (ref 0.61–1.24)
GFR, Estimated: >60 mL/min (ref >60)
Glucose, Bld: 101 mg/dL — ABNORMAL HIGH (ref 70–99)
Potassium: 4.1 mmol/L (ref 3.5–5.1)
Sodium: 137 mmol/L (ref 135–145)
Total Bilirubin: 0.9 mg/dL (ref 0.0–1.2)
Total Protein: 7.9 g/dL (ref 6.5–8.1)

## 2023-11-25 LAB — ACETAMINOPHEN LEVEL: Acetaminophen (Tylenol), Serum: <10 ug/mL — ABNORMAL LOW (ref 10–30)

## 2023-11-25 LAB — SALICYLATE LEVEL: Salicylate Lvl: <7 mg/dL — ABNORMAL LOW (ref 7.0–30.0)

## 2023-11-25 LAB — ETHANOL: Alcohol, Ethyl (B): <10 mg/dL (ref <10)

## 2023-11-25 NOTE — ED Provider Notes (Cosign Needed)
[place] EMERGENCY DEPARTMENT AT [facility] Provider Note   CSN: [id number] Arrival date & time: 11/25/23  7829    History  Chief Complaint  Patient presents with   Mental Health Problem    George Kelley is a 43 y.o. male history of schizoaffective, anxiety.  Patient here because he says he has had increase in anxiety.  He denies any SI, HI, AVH.  He states he has had intermittent difficulties with sleeping over the last week or so.  He has been otherwise well, he has been taking care of his significant other who has been sick.  Eating and drinking at home.  No cough, fever, rhinorrhea.  He denies any intrusive thoughts.  He states he attempted to call to get appoint behavioral health however they hung up on him.  He did state he was trying to tell them why he has PTSD and he thought they were yelling at him.  He does states that he has history of PTSD with police from a prior altercation.  He states he has not recently been involved with police.  HPI     Home Medications Prior to Admission medications   Medication Sig Start Date End Date Taking? Authorizing Provider  albuterol (PROVENTIL HFA;VENTOLIN HFA) 108 (90 Base) MCG/ACT inhaler Inhale 1-2 puffs into the lungs every 6 (six) hours as needed for wheezing or shortness of breath. 10/14/16   Alvina Chou, PA  amLODipine (NORVASC) 10 MG tablet Take 1 tablet (10 mg total) by mouth daily. For high blood pressure 02/16/21   Armandina Stammer I, NP  divalproex (DEPAKOTE ER) 250 MG 24 hr tablet Take 3 tablets (750 mg total) by mouth every 12 (twelve) hours. For mood stabilization 04/30/21   Nwoko, Stephens Shire E, PA  hydrOXYzine (ATARAX/VISTARIL) 25 MG tablet Take 1 tablet (25 mg total) by mouth 3 (three) times daily as needed for anxiety. 04/30/21   Nwoko, Tommas Olp, PA  meloxicam (MOBIC) 15 MG tablet Take 1 tablet (15 mg total) by mouth daily. 08/31/22   Hyatt, Max T, DPM  meloxicam (MOBIC) 15 MG tablet Take 1 tablet (15 mg  total) by mouth daily. 01/04/23   Hyatt, Max T, DPM  meloxicam (MOBIC) 15 MG tablet Take 1 tablet (15 mg total) by mouth daily. 03/24/23   Hyatt, Max T, DPM  methylPREDNISolone (MEDROL DOSEPAK) 4 MG TBPK tablet 6 day dose pack - take as directed 01/04/23   Hyatt, Max T, DPM  naproxen (NAPROSYN) 500 MG tablet Take 1 tablet (500 mg total) by mouth 2 (two) times daily. 07/16/22   Raspet, Noberto Retort, PA-C  nicotine (NICODERM CQ - DOSED IN MG/24 HOURS) 21 mg/24hr patch Place 1 patch (21 mg total) onto the skin daily at 6 (six) AM. (May buy from over the counter): For smoking cessation 02/15/21   Armandina Stammer I, NP  risperiDONE (RISPERDAL M-TABS) 2 MG disintegrating tablet Take 1 tablet (2 mg total) by mouth 2 (two) times daily. For mood control 04/30/21   Nwoko, Stephens Shire E, PA  tiZANidine (ZANAFLEX) 4 MG tablet Take 1 tablet (4 mg total) by mouth every 6 (six) hours as needed for muscle spasms. 02/15/21   Armandina Stammer I, NP  traZODone (DESYREL) 50 MG tablet Take 1 tablet (50 mg total) by mouth at bedtime as needed for sleep. 05/03/21   Nwoko, Tommas Olp, PA      Allergies    Pork-derived products and Tomato    Review of Systems   Review of  Systems  Constitutional: Negative.   HENT: Negative.    Respiratory: Negative.    Cardiovascular: Negative.   Gastrointestinal: Negative.   Genitourinary: Negative.   Musculoskeletal: Negative.   Skin: Negative.   Neurological: Negative.   Psychiatric/Behavioral:  Positive for sleep disturbance. Negative for self-injury and suicidal ideas. The patient is nervous/anxious.   All other systems reviewed and are negative.   Physical Exam Updated Vital Signs BP (!) 139/102 (BP Location: Left Arm)   Pulse 92   Temp 98.1 F (36.7 C) (Oral)   Resp 18   Wt 105.2 kg   SpO2 97%   BMI 27.51 kg/m  Physical Exam Vitals and nursing note reviewed.  Constitutional:      General: He is not in acute distress.    Appearance: He is well-developed. He is not ill-appearing,  toxic-appearing or diaphoretic.  HENT:     Head: Atraumatic.     Nose: Nose normal.     Mouth/Throat:     Mouth: Mucous membranes are moist.  Eyes:     Pupils: Pupils are equal, round, and reactive to light.  Cardiovascular:     Rate and Rhythm: Normal rate and regular rhythm.  Pulmonary:     Effort: Pulmonary effort is normal. No respiratory distress.     Breath sounds: Normal breath sounds.  Abdominal:     General: Bowel sounds are normal. There is no distension.     Palpations: Abdomen is soft.  Musculoskeletal:        General: Normal range of motion.     Cervical back: Normal range of motion and neck supple.  Skin:    General: Skin is warm and dry.  Neurological:     General: No focal deficit present.     Mental Status: He is alert and oriented to person, place, and time.  Psychiatric:        Attention and Perception: Attention normal.        Mood and Affect: Mood is elated (intermittent).        Speech: Speech normal.        Behavior: Behavior is hyperactive. Behavior is cooperative.        Thought Content: Thought content normal.     Comments: Denies SI, HI, AVH. Cooperative on exam, does get elated speech when speaking about the police however when discussing with him that this was in the past he is able to self calm.      ED Results / Procedures / Treatments   Labs (all labs ordered are listed, but only abnormal results are displayed) Labs Reviewed  COMPREHENSIVE METABOLIC PANEL - Abnormal; Notable for the following components:      Result Value   Glucose, Bld 101 (*)    BUN 21 (*)    All other components within normal limits  CBC WITH DIFFERENTIAL/PLATELET - Abnormal; Notable for the following components:   MCV 103.6 (*)    MCH 34.1 (*)    All other components within normal limits  ACETAMINOPHEN LEVEL - Abnormal; Notable for the following components:   Acetaminophen (Tylenol), Serum <10 (*)    All other components within normal limits  SALICYLATE LEVEL -  Abnormal; Notable for the following components:   Salicylate Lvl <7.0 (*)    All other components within normal limits  ETHANOL  RAPID URINE DRUG SCREEN, HOSP PERFORMED    EKG None  Radiology No results found.  Procedures Procedures    Medications Ordered in ED Medications - No data to  display  ED Course/ Medical Decision Making/ A&P   43 year old history of schizoaffective here for evaluation of anxiety.  He states he been taking his meds at home.  He has not been sleeping over the last few days.  He did states been trying to get a hold of behavioral health however they are hanging up on him as he keeps trying to describe his history PTSD.  He does get elated speech when speaking about the police however he is able to self calm when speaking but different activities. Denies any SI, HI, AVH.  States has been otherwise well however been taking care of a sick family member.  Will plan on labs.  He is here voluntarily.  Labs personally viewed and interpreted: Labs personally viewed interpreted, no significant abnormality  Patient eloped from the emergency department prior to my reassessment.  He was here voluntarily.  Appear to have capacity to make medical decisions while here.  He was not under involuntary commitment.                                Medical Decision Making Amount and/or Complexity of Data Reviewed External Data Reviewed: labs and notes. Labs: ordered. Decision-making details documented in ED Course.  Risk Decision regarding hospitalization. Diagnosis or treatment significantly limited by social determinants of health.          Final Clinical Impression(s) / ED Diagnoses Final diagnoses:  Anxiety  Eloped from emergency department    Rx / DC Orders ED Discharge Orders     None         Jasyn Mey A, PA-C 11/25/23 2152

## 2023-11-25 NOTE — ED Notes (Signed)
Pt eloped while this RN was administering medications to another pt. Lyric, triage tech observed pt walking out through triage. Britni, PA made aware.

## 2023-11-25 NOTE — ED Provider Triage Note (Signed)
Emergency Medicine Provider Triage Evaluation Note  George Kelley , a 43 y.o. male  was evaluated in triage.  Pt complains of anxiety. Hx of ptsd, schizophrenia. Not sleeping at home. No SI, HI, AVH. Compliant with home meds  Review of Systems  Positive: Anxiety, not sleeping Negative:   Physical Exam  BP (!) 193/119 (BP Location: Right Arm)   Pulse 96   Temp 98.6 F (37 C) (Oral)   Resp 18   Wt 105.2 kg   SpO2 97%   BMI 27.51 kg/m  Gen:   Awake, no distress   Resp:  Normal effort  MSK:   Moves extremities without difficulty  Other:  No SI, HI, AVH  Medical Decision Making  Medically screening exam initiated at 7:43 PM.  Appropriate orders placed.  Robert Sperl was informed that the remainder of the evaluation will be completed by another provider, this initial triage assessment does not replace that evaluation, and the importance of remaining in the ED until their evaluation is complete.  Anxiety, not sleeping   Naphtali Zywicki A, PA-C 11/25/23 1944

## 2023-11-25 NOTE — ED Triage Notes (Signed)
POV/ ambulatory/ c/o having thoughts of harming others, police especially/ pt is calm but using speaking inappropriately/ pt is scared he may hurt someone/ pt has hx of bipolar/ schizophrenia/ pt wants  help with his medication/ he states he can't sleep

## 2023-11-28 ENCOUNTER — Ambulatory Visit (HOSPITAL_COMMUNITY)
Admission: EM | Admit: 2023-11-28 | Discharge: 2023-11-28 | Disposition: A | Discharge status: Home / Self Care | Payer: 59 | Attending: Psychiatry | Admitting: Psychiatry

## 2023-11-28 DIAGNOSIS — Z789 Other specified health status: Secondary | ICD-10-CM

## 2023-11-28 DIAGNOSIS — F250 Schizoaffective disorder, bipolar type: Secondary | ICD-10-CM | POA: Diagnosis not present

## 2023-11-28 DIAGNOSIS — Z79899 Other long term (current) drug therapy: Secondary | ICD-10-CM | POA: Diagnosis not present

## 2023-11-28 DIAGNOSIS — I509 Heart failure, unspecified: Secondary | ICD-10-CM | POA: Diagnosis not present

## 2023-11-28 DIAGNOSIS — E119 Type 2 diabetes mellitus without complications: Secondary | ICD-10-CM | POA: Diagnosis not present

## 2023-11-28 NOTE — Progress Notes (Signed)
[name] to be D/C'd home per NP order. An After Visit Summary was printed and given to the patient by provider. Patient escorted out and D/C home via private auto.  Dickie La  11/28/2023 1:27 PM

## 2023-11-28 NOTE — ED Provider Notes (Signed)
Behavioral Health Urgent Care Medical Screening Exam  Patient Name: George Kelley MRN: [id number] Date of Evaluation: 11/28/23 Chief Complaint:  "I need a psychiatrist and therapist" Diagnosis:  Final diagnoses:  Schizoaffective disorder, bipolar type (HCC)  Needs assistance with community resources    History of Present illness: George Kelley is a 43 y.o. male patient presented to [facility] as a walk in accompanied by his male friend with complaints of an irregular psychiatrist and  [name], 43 y.o., male patient seen face to face by this provider, chart reviewed, and consulted with Dr. Joyce Gross on 11/28/23.  Per chart review patient has a past psychiatric history of schizoaffective disorder bipolar type.  Reports that he has been psychiatrically hospitalized two times.  Once in 2016 and again in 01/2021 at [facility]. He is prescribed Depakote and risperidone but unsure of dosage. States it is the same dosage he was discharged home with in 2022.  Which per chart review is Depakote 750 mg 3 times daily and risperidone 2 mg twice daily.  He is prescribed medications by his PCP and currently has no therapy in place.  He lives in the home with his spouse whom has diabetes and CHF, he is primary caregiver.  He is on disability and is unemployed.  He endorses occasional marijuana use, denies all other substance use.  On evaluation George Kelley reports in 2022 he entered into an altercation with police which resulted in him being admitted to [facility] for inpatient psychiatric admission.  States during that time the police accused him of pulling a machete on them and threatening to kill them.  He denies these accusations.  States since that time the police have harassed him and they want him to pay restitution each month.  He is been ordered to be on probation for 2 years.  He is also ordered by the judge to participate in therapy.  He presents today seeking therapy resources.  He initially  presented to [facility] but was told that he is not allowed onto the property.  He has  also contacted [facility] on the second floor outpatient services but due to having Medicare as primary carrier he is not eligible for their services.  During evaluation George Kelley is observed sitting in assessment room in no acute distress.  He is casually dressed and makes fair eye contact.  He is alert/oriented x 4, cooperative, and fairly attentive.  His speech is clear and coherent but at times is pressured when he is discussing the situation with the police.  He appears anxious.  He denies depression and anxiety. He reports compliance with his medications and feels that his mood is stable.  He denies suicidal ideations.  He denies any previous suicide attempts.  He denies access to firearms/weapons.  He appears to be paranoid towards the police.  He continues to be upset that the police did take his weapons away due to his initial altercation with them in 2022.  He denies homicidal ideations.  He denies auditory/visual hallucinations.  He does not appear manic or psychotic.  He does not appear to be responding to internal/external stimuli.  Collateral: Patient provided permission to speak with his wife My [name] at [phone].  Ms. Cantara has no immediate safety concerns with patient being discharged home.  She agrees with patient that the police are telling lies about him and that they are harassing him.  They are making him participate in therapy.  Reports he is her primary caregiver.  She confirms that the police took patient's guns away and there are no weapons in the home.  In addition she has noticed no change in patient's behavior, no SI/HI, nor has she observed him experiencing any type of auditory or visual hallucinations.  Collateral: Patient gave admission to speak with his male friend that presented with him whom is waiting in the lobby.  On approach she states she is patient's girlfriend.   Reports she is with him when he is not at home with his wife.  States his wife is aware that they are in a relationship.  However his wife is older and has multiple physical issues.  She has noticed no change in patient's behavior.  States he does not mention SI/HI.  She does not notice him experiencing any type of auditory or visual hallucinations.  She also agrees that the police are harassing patient. He answered questions appropriately.   Discussed treatment options with patient.  Patient does not believe that he needs to be admitted to the psychiatric hospital at this time because he feels stable.  He is willing to participate in therapy.  He is also seeking a psychiatrist at the recommendation of his PCP.  Provided multiple outpatient resources for therapy and medication management.  These resources were explained to patient and he verbalized understanding.   He is also educated and verbalizes understanding of mental health resources and other crisis services in the community.  He is instructed to call 911 and present to the nearest emergency room should he he/she experience any suicidal/homicidal ideation, auditory/visual/hallucinations, or detrimental worsening of he is mental health condition.  He was a also advised by Clinical research associate that he could call the toll-free phone on back of  insurance card to assist with identifying counselors and agencies in network.  Flowsheet Row ED from 11/28/2023 in [facility] ED from 11/25/2023 in [facility] Emergency Department at [facility] ED from 07/16/2022 in [facility] Urgent Care at [facility] RISK CATEGORY No Risk No Risk No Risk       Psychiatric Specialty Exam  Presentation  General Appearance:Casual  Eye Contact:Fair  Speech:Clear and Coherent; Pressured (pressured at times but appears to be baseline)  Speech Volume:Normal  Handedness:Right   Mood and Affect   Mood:Anxious  Affect:Congruent   Thought Process  Thought Processes:Coherent  Descriptions of Associations:Intact  Orientation:Full (Time, Place and Person)  Thought Content:Logical  Diagnosis of Schizophrenia or Schizoaffective disorder in past: No data recorded Duration of Psychotic Symptoms: No data recorded Hallucinations:None  Ideas of Reference:None  Suicidal Thoughts:No  Homicidal Thoughts:No   Sensorium  Memory:Recent Good; Remote Good; Immediate Good  Judgment:Fair  Insight:Fair   Executive Functions  Concentration:Fair  Attention Span:Fair  Recall:Fair  Fund of Knowledge:Fair  Language:Fair   Psychomotor Activity  Psychomotor Activity:Normal   Assets  Assets:Physical Health; Leisure Time; Resilience; Social Support   Sleep  Sleep:Good  Number of hours: No data recorded  Physical Exam: Physical Exam Constitutional:      Appearance: Normal appearance.  Eyes:     General:        Right eye: No discharge.        Left eye: No discharge.  Cardiovascular:     Rate and Rhythm: Normal rate.  Pulmonary:     Effort: Pulmonary effort is normal. No respiratory distress.  Musculoskeletal:        General: Normal range of motion.     Cervical back: Normal range of motion.  Neurological:     Mental Status: He is alert and oriented to person, place, and time.  Psychiatric:        Attention and Perception: Attention and perception normal.        Mood and Affect: Mood is anxious.        Speech: Speech normal.        Behavior: Behavior normal. Behavior is cooperative.        Thought Content: Thought content is paranoid.        Cognition and Memory: Cognition normal.        Judgment: Judgment normal.    Review of Systems  Constitutional:  Negative for chills and fever.  HENT:  Negative for hearing loss.   Respiratory:  Negative for cough and shortness of breath.   Cardiovascular:  Negative for chest pain.  Musculoskeletal: Negative.    Neurological:  Negative for speech change.  Psychiatric/Behavioral:  The patient is nervous/anxious.    Blood pressure 134/80, pulse 100, temperature 98.9 F (37.2 C), temperature source Oral, SpO2 100%. There is no height or weight on file to calculate BMI.  Musculoskeletal: Strength & Muscle Tone: within normal limits Gait & Station: normal Patient leans: N/A   [facility] MSE Discharge Disposition for Follow up and Recommendations: Based on my evaluation the patient does not appear to have an emergency medical condition and can be discharged with resources and follow up care in outpatient services for Medication Management and Individual Therapy  Discharge patient  Provided outpatient psychiatric resources for medication management and therapy.   Ardis Hughs, NP 11/28/2023, 1:42 PM

## 2023-11-28 NOTE — Progress Notes (Signed)
   11/28/23 0945  [facility] Triage Screening (Walk-ins at [facility] only)  How Did You Hear About [place]? Family/Friend  What Is the Reason for Your Visit/Call Today? George Kelley is a 43 year old male presenting to [facility] unaccompanied. Pt is looking for a psychartist and therapist. Pt mentions he is on probation for 2 years. Pt mentions that he smoked one blunt yesterday and uses weed occasionally. Pt reports that he is wanting to talk to a therapist today and that he was not able to get into [facility]. Pt denies alcohol use, Si, Hi and Avh.  How Long Has This Been Causing You Problems? <Week  Have You Recently Had Any Thoughts About Hurting Yourself? No  Are You Planning to Commit Suicide/Harm Yourself At This time? No  Have you Recently Had Thoughts About Hurting Someone [name]? No  Are You Planning To Harm Someone At This Time? No  Physical Abuse Denies  Verbal Abuse Denies  Sexual Abuse Denies  Exploitation of patient/patient's resources Denies  Self-Neglect Denies  Possible abuse reported to: Other (Comment)  Are you currently experiencing any auditory, visual or other hallucinations? No  Have You Used Any Alcohol or Drugs in the Past 24 Hours? Yes  What Did You Use and How Much? one blunt  Do you have any current medical co-morbidities that require immediate attention? No  Clinician description of patient physical appearance/behavior: agitated, cooperative  What Do You Feel Would Help You the Most Today? Stress Management  If access to [facility] Urgent Care was not available, would you have sought care in the Emergency Department? No  Determination of Need Routine (7 days)  Options For Referral Other: Comment;Outpatient Therapy

## 2023-11-28 NOTE — Discharge Instructions (Addendum)
 Discharge recommendations:   Medications: Patient is to take medications as prescribed. The patient or patient's guardian is to contact a medical professional and/or outpatient provider to address any new side effects that develop. The patient or the patient's guardian should update outpatient providers of any new medications and/or medication changes.    Outpatient Follow up: Please review list of outpatient resources for psychiatry and counseling. Please follow up with your primary care provider for all medical related needs.    Therapy: We recommend that patient participate in individual therapy to address mental health concerns.   Atypical antipsychotics: If you are prescribed an atypical antipsychotic, it is recommended that your height, weight, BMI, blood pressure, fasting lipid panel, and fasting blood sugar be monitored by your outpatient providers.  Safety:   The following safety precautions should be taken:   No sharp objects. This includes scissors, razors, scrapers, and putty knives.   Chemicals should be removed and locked up.   Medications should be removed and locked up.   Weapons should be removed and locked up. This includes firearms, knives and instruments that can be used to cause injury.   The patient should abstain from use of illicit substances/drugs and abuse of any medications.  If symptoms worsen or do not continue to improve or if the patient becomes actively suicidal or homicidal then it is recommended that the patient return to the closest hospital emergency department, the [facility], or call 911 for further evaluation and treatment. National Suicide Prevention Lifeline 1-800-SUICIDE or (581)424-7081.  About 988 988 offers 24/7 access to trained crisis counselors who can help people experiencing mental health-related distress. People can call or text 988 or chat 988lifeline.org for themselves or if they are worried about a  loved one who may need crisis support.    It is imperative that you follow through with treatment recommendations within 5-7 days from the day of discharge to mitigate further risk to your safety and overall mental well-being.  A list of outpatient therapy and psychiatric providers for medication management has been provided below to get you started in finding the right provider for you.            [facility] [address] [place], [place], [address] [phone] phone (Medicare, Private insurance except Tricare, [place], and [facility])  [facility] [address] [place], [place], [address] [address] phone ([address], AmeriHealth [facility] [place], [address], [place], [place], Friday Health Plans, [address], BCBS Healthy [place], [place], [address], [place], [place], [place], [place], Tricare, [facility], [employer]co Corporation, Eli Lilly and Company)  [employer] [phone] [address] [place], [place], [address] [phone] phone [phone] phone [phone] fax  [facility] [address] [place], [place], [address] [phone] phone (Call to confirm insurance coverage) Consultation & Support Services     o Drop-In Hours: 1:00 PM to 5:00 PM     o Days: Monday - Thursday  Crisis Services (24/7)   [facility] [address] [place], [place], [address] [phone] phone ([address] [place], [place], [place] Medicaid, [place] and [place], [facility])      [facility] [address] [place], [place], [address] [phone] phone [email or web address]  (to complete the intake form and upload ID and insurance cards)  [facility] [address] [place], [place], [address] [phone] phone ([address], [address], [place], [address] Complete [place], [place], [place],  Optum, UHC, Eli Lilly and Company, and certain  Medicaid plans)  [facility] [phone] [address] [place], [place], [address] [phone] phone [phone] fax (Medicaid, Medicare, Self-pay, call about other insurance coverage)  [facility] (age 75+) [address] [place], [place], [address] [phone] hone [phone] fax ([place], [address], [place], [place], [place], [address], [place], [place], [place], [place], certain [employer], [facility], UMR)  [employer] [address], [place], [address] [phone] phone (Medicare, Medicaid, Artemio Aly, call about other insurance coverage)  [facility] [address] [place], [place], [address] [phone] phone [phone] fax (Call [phone] to see what insurance is accepted) Archer Asa, MD specializes in geropsych)  [facility]  (medication management only) [address] [place], [place], [address] [phone] phone [phone] fax ([address], Medicaid, [place], [place], [place], [place], [place], [place], [place])  [facility] (medication management only) [address] [place], [place], [address] [phone] phone [phone] fax ([address] Medicare, [place], [place], Tricare [place])  [facility] [address] [place], [place], [address] [phone] phone [phone] fax ([address] [place], [place], [place], [place], [facility], [facility] Medicaid/[place] Health Choice)  [facility] [place], [employer]. [address] [place], [place], [address] [phone] phone [phone] fax (Medicare, Medicaid, [facility])  [facility] [address] [place], [place] [address] [phone] phone ([address], [place], [place], [place], [place], Medicare, [place], [facility]) Does genetic testing for medications; does transcranial magnetic stimulation along with basic services)  [facility] [address] [place], [place], [address] [phone] phone (Call about insurance coverage)  [facility] [address] [place], [place], [address] [phone] phone [phone] fax (Call about insurance coverage)  [facility] [address] [place], [place], [address] [phone] phone [phone] fax (Call about insurance coverage)  [employer] [phone] [address], [place], [address] [phone] phone (Medicaid, Tricare, [place], [place], [place])  [employer] [address] [place], [place], [address] [phone] phone (Medicaid, Medicare, call about other insurance coverage)  [facility] [address] [place], [place], [address] [phone] phone [phone] fax (Medicaid, Medicare, Tricare, call about other insurance coverage)  [facility] [address] [place], [address] [phone] phone ([address], [place], [place], [place], [place] types - [place], Secretary/administrator, Partners, [place], [place] Health Choice, Healthy [place], [place], [place], and Complete)  [facility] [address] [place], [place], [address] [phone] phone Completely online treatment platform Contact: Personal assistant - [employer] Specialist [phone] phone [phone] fax ([address] [place], [place], Friday Health Plan, [place], [place], [place], [place], [place], [place])

## 2024-01-17 ENCOUNTER — Ambulatory Visit (INDEPENDENT_AMBULATORY_CARE_PROVIDER_SITE_OTHER): Payer: 59 | Admitting: Licensed Clinical Social Worker

## 2024-01-17 DIAGNOSIS — F4310 Post-traumatic stress disorder, unspecified: Secondary | ICD-10-CM | POA: Diagnosis not present

## 2024-01-17 DIAGNOSIS — F250 Schizoaffective disorder, bipolar type: Secondary | ICD-10-CM

## 2024-01-18 ENCOUNTER — Encounter (HOSPITAL_COMMUNITY): Payer: Self-pay | Admitting: Licensed Clinical Social Worker

## 2024-01-18 NOTE — Progress Notes (Signed)
   THERAPIST PROGRESS NOTE  Session Time: 8:10am-9:00am  Participation Level: Active  Behavioral Response: CasualAlertAngry, Anxious, and Irritable  Type of Therapy: Individual Therapy  Treatment Goals addressed: anger, depression, PTSD  ProgressTowards Goals: Not Progressing  Interventions: Supportive  Summary: George Kelley is a 43 y.o. male who presents with Schizoaffective Disorder, bipolar type and PTSD  Suicidal/Homicidal: Nowithout intent/plan  Therapist Response: George Kelley engaged well in individual and person session with clinician.  Clinician spent much of the session building rapport and listening to George Kelley's concerns.  Clinician identified perceptions of inappropriate treatment by the police, by the community, and racism.  Clinician validated George Kelley's concerns and explored coping skills, as well as barriers to living a calm life.  Clinician reviewed medications and noted that George Kelley reports compliance.  Clinician explored goals and identified George Kelley's need for a civil rights attorney.  Clinician explored behavioral health supports necessary.  Clinician identified the importance of maintaining composure while in court rooms and with judges and police officers.  Plan: Return again in 2 weeks.  Diagnosis: Schizoaffective disorder, bipolar type (HCC)  PTSD (post-traumatic stress disorder)  Collaboration of Care: Psychiatrist AEB George Kelley receives [facility].  However he has been court mandated to meet with a psychiatrist.  He is currently scheduled to see Dr. Lolly Mustache in the coming month.  However he shared interest in working with a male provider.  Clinician will seek out a male provider but encouraged George Kelley to keep the appointment with Dr. Lolly Mustache for now.  Patient/Guardian was advised Release of Information must be obtained prior to any record release in order to collaborate their care with an outside provider. Patient/Guardian  was advised if they have not already done so to contact the registration department to sign all necessary forms in order for [place] to release information regarding their care.   Consent: Patient/Guardian gives verbal consent for treatment and assignment of benefits for services provided during this visit. Patient/Guardian expressed understanding and agreed to proceed.   Chryl Heck [place], LCSW 01/18/2024

## 2024-02-02 ENCOUNTER — Ambulatory Visit (HOSPITAL_COMMUNITY): Admitting: Licensed Clinical Social Worker

## 2024-02-16 ENCOUNTER — Ambulatory Visit (HOSPITAL_COMMUNITY): Payer: 59 | Admitting: Psychiatry

## 2024-03-01 ENCOUNTER — Encounter (HOSPITAL_COMMUNITY): Payer: Self-pay

## 2024-03-01 ENCOUNTER — Ambulatory Visit (HOSPITAL_COMMUNITY): Admitting: Licensed Clinical Social Worker

## 2024-03-01 ENCOUNTER — Encounter (HOSPITAL_COMMUNITY): Payer: Self-pay | Admitting: Licensed Clinical Social Worker

## 2024-03-01 DIAGNOSIS — F250 Schizoaffective disorder, bipolar type: Secondary | ICD-10-CM | POA: Diagnosis not present

## 2024-03-01 DIAGNOSIS — F4310 Post-traumatic stress disorder, unspecified: Secondary | ICD-10-CM

## 2024-03-01 NOTE — Progress Notes (Signed)
 Virtual Visit via Video Note  I connected with George Kelley on 03/01/24 at  2:30 PM EDT by a video enabled telemedicine application and verified that I am speaking with the correct person using two identifiers.  Location: Patient: home Provider: home office   I discussed the limitations of evaluation and management by telemedicine and the availability of in person appointments. The patient expressed understanding and agreed to proceed.   I discussed the assessment and treatment plan with the patient. The patient was provided an opportunity to ask questions and all were answered. The patient agreed with the plan and demonstrated an understanding of the instructions.   The patient was advised to call back or seek an in-person evaluation if the symptoms worsen or if the condition fails to improve as anticipated.  I provided 45 minutes of non-face-to-face time during this encounter.   George Dago, LCSW   THERAPIST PROGRESS NOTE  Session Time: 2:30pm-3:15pm  Participation Level: Active  Behavioral Response: NeatAlertAngry  Type of Therapy: Individual Therapy  Treatment Goals addressed:  Problem: BH CCP Acute or Chronic Trauma Reaction     Dates: Start:  03/01/24       Disciplines: Interdisciplinary, PROVIDER        Goal: LTG: Recall traumatic events without becoming overwhelmed with negative emotions     Dates: Start:  03/01/24    Expected End:  09/01/24       Disciplines: Interdisciplinary, PROVIDER             Goal: STG: Sartaj will describe the signs and symptoms of PTSD that are experienced and how they interfere with daily living     Dates: Start:  03/01/24    Expected End:  09/01/24       Disciplines: Interdisciplinary, PROVIDER             Goal: STG: Razi will identify coping strategies to deal with trauma memories and the associated emotional reaction     Dates: Start:  03/01/24    Expected End:  09/01/24       Disciplines: Interdisciplinary,  PROVIDER             Goal: STG: Savon will verbalize an increased sense of mastery over PTSD symptoms by using several techniques to cope with flashbacks, decrease the power of triggers, and decrease negative thinking     Dates: Start:  03/01/24    Expected End:  09/01/24       Disciplines: Interdisciplinary, PROVIDER            ProgressTowards Goals: Initial  Interventions: CBT  Summary: Bartlomiej Burrs is a 43 y.o. male who presents with Schizoaffective disorder, bipolar type.   Suicidal/Homicidal: Nowithout intent/plan  Therapist Response: Blase engaged well in individual virtual session with clinician. Clinician utilized CBT to process current thoughts, feelings, and interactions. Clinician identified ongoing daily flashbacks, anger, and hypervigilance following traumatic experience with police 6 months ago which has destroyed his life in many aspects. Clinician processed thoughts and feelings with TFCBT. Clinician discussed Zaryan's goal of writing his story. Clinician provided support and agreed to utilized the narrative aspect of TFCBT to process and also share his story. Mood is irritable regarding this situation. However, in other contexts, Grigoriy is able to be calm, relaxed, and quiet. He shared he is working on finding peace in his life.   Plan: Return again in 1-2 weeks.  Diagnosis: Schizoaffective disorder, bipolar type (HCC)  PTSD (post-traumatic stress disorder)  Collaboration of Care: Patient refused AEB  none required  Patient/Guardian was advised Release of Information must be obtained prior to any record release in order to collaborate their care with an outside provider. Patient/Guardian was advised if they have not already done so to contact the registration department to sign all necessary forms in order for us  to release information regarding their care.   Consent: Patient/Guardian gives verbal consent for treatment and assignment of benefits for services  provided during this visit. Patient/Guardian expressed understanding and agreed to proceed.   Merleen Stare [place], LCSW 03/01/2024

## 2024-03-08 ENCOUNTER — Ambulatory Visit (INDEPENDENT_AMBULATORY_CARE_PROVIDER_SITE_OTHER): Admitting: Licensed Clinical Social Worker

## 2024-03-08 DIAGNOSIS — F250 Schizoaffective disorder, bipolar type: Secondary | ICD-10-CM

## 2024-03-19 ENCOUNTER — Encounter (HOSPITAL_COMMUNITY): Payer: Self-pay | Admitting: Licensed Clinical Social Worker

## 2024-03-19 NOTE — Progress Notes (Signed)
 Virtual Visit via Video Note  I connected with George Kelley on 03/08/24 at  1:30 PM EDT by a video enabled telemedicine application and verified that I am speaking with the correct person using two identifiers.  Location: Patient: home Provider: home office   I discussed the limitations of evaluation and management by telemedicine and the availability of in person appointments. The patient expressed understanding and agreed to proceed.   I discussed the assessment and treatment plan with the patient. The patient was provided an opportunity to ask questions and all were answered. The patient agreed with the plan and demonstrated an understanding of the instructions.   The patient was advised to call back or seek an in-person evaluation if the symptoms worsen or if the condition fails to improve as anticipated.  I provided 45 minutes of non-face-to-face time during this encounter.   [place] Dago, LCSW   THERAPIST PROGRESS NOTE  Session Time: 1:30pm-2:15pm  Participation Level: Active  Behavioral Response: NeatAlertAngry  Type of Therapy: Individual Therapy  Treatment Goals addressed:       Problem: BH CCP Acute or Chronic Trauma Reaction      Dates: Start:  03/01/24        Disciplines: Interdisciplinary, PROVIDER            Goal: LTG: Recall traumatic events without becoming overwhelmed with negative emotions     Dates: Start:  03/01/24    Expected End:  09/01/24       Disciplines: Interdisciplinary, PROVIDER                Goal: STG: Sharif will describe the signs and symptoms of PTSD that are experienced and how they interfere with daily living     Dates: Start:  03/01/24    Expected End:  09/01/24       Disciplines: Interdisciplinary, PROVIDER                Goal: STG: Clair will identify coping strategies to deal with trauma memories and the associated emotional reaction     Dates: Start:  03/01/24    Expected End:  09/01/24       Disciplines:  Interdisciplinary, PROVIDER                Goal: STG: Millie will verbalize an increased sense of mastery over PTSD symptoms by using several techniques to cope with flashbacks, decrease the power of triggers, and decrease negative thinking     Dates: Start:  03/01/24    Expected End:  09/01/24       Disciplines: Interdisciplinary, PROVIDER             ProgressTowards Goals: Progressing  Interventions: CBT  Summary: Kais Kelley is a 43 y.o. male who presents with Schizoaffective Disorder, bipolar type.   Suicidal/Homicidal: Nowithout intent/plan  Therapist Response: Mayan engaged well in individual virtual session with clinician. Clinician utilized CBT to process thoughts, feelings, and interactions. Clinician explored current mood and did head to toe body scan to address anxiety, pain, muscle tightness, and stress. Clinician provided time and space for George Kelley to start writing his trauma narrative/life story. Clinician typed his words and assisted in stress management while the process was going. Clinician allowed George Kelley to take breaks and stop when he was overwhelmed. Clinician assisted in de-escalation.   Plan: Return again in 2 weeks.  Diagnosis: Schizoaffective disorder, bipolar type (HCC)  Collaboration of Care: Patient refused AEB none required  Patient/Guardian was advised Release of Information must  be obtained prior to any record release in order to collaborate their care with an outside provider. Patient/Guardian was advised if they have not already done so to contact the registration department to sign all necessary forms in order for us  to release information regarding their care.   Consent: Patient/Guardian gives verbal consent for treatment and assignment of benefits for services provided during this visit. Patient/Guardian expressed understanding and agreed to proceed.   Merleen Stare [place], LCSW 03/19/2024

## 2024-04-04 ENCOUNTER — Ambulatory Visit (INDEPENDENT_AMBULATORY_CARE_PROVIDER_SITE_OTHER): Admitting: Licensed Clinical Social Worker

## 2024-04-04 DIAGNOSIS — F4310 Post-traumatic stress disorder, unspecified: Secondary | ICD-10-CM | POA: Diagnosis not present

## 2024-04-04 DIAGNOSIS — F250 Schizoaffective disorder, bipolar type: Secondary | ICD-10-CM | POA: Diagnosis not present

## 2024-04-06 ENCOUNTER — Encounter (HOSPITAL_COMMUNITY): Payer: Self-pay | Admitting: Licensed Clinical Social Worker

## 2024-04-06 NOTE — Progress Notes (Signed)
 Virtual Visit via Video Note  I connected with George Kelley on 04/04/24 at  1:30 PM EDT by a video enabled telemedicine application and verified that I am speaking with the correct person using two identifiers.  Location: Patient: Home Provider: Home office   I discussed the limitations of evaluation and management by telemedicine and the availability of in person appointments. The patient expressed understanding and agreed to proceed.     I discussed the assessment and treatment plan with the patient. The patient was provided an opportunity to ask questions and all were answered. The patient agreed with the plan and demonstrated an understanding of the instructions.   The patient was advised to call back or seek an in-person evaluation if the symptoms worsen or if the condition fails to improve as anticipated.  I provided 45 minutes of non-face-to-face time during this encounter.   George Dago, LCSW   THERAPIST PROGRESS NOTE  Session Time: 1:30pm-2:15pm  Participation Level: Active  Behavioral Response: NeatAlertAngry and Irritable  Type of Therapy: Individual Therapy  Treatment Goals addressed:  Problem: BH CCP Acute or Chronic Trauma Reaction        Dates: Start:  03/01/24          Disciplines: Interdisciplinary, PROVIDER              Goal: LTG: Recall traumatic events without becoming overwhelmed with negative emotions     Dates: Start:  03/01/24    Expected End:  09/01/24       Disciplines: Interdisciplinary, PROVIDER                Goal: STG: George Kelley will describe the signs and symptoms of PTSD that are experienced and how they interfere with daily living     Dates: Start:  03/01/24    Expected End:  09/01/24       Disciplines: Interdisciplinary, PROVIDER                Goal: STG: George Kelley will identify coping strategies to deal with trauma memories and the associated emotional reaction     Dates: Start:  03/01/24    Expected End:  09/01/24        Disciplines: Interdisciplinary, PROVIDER                Goal: STG: George Kelley will verbalize an increased sense of mastery over PTSD symptoms by using several techniques to cope with flashbacks, decrease the power of triggers, and decrease negative thinking     Dates: Start:  03/01/24    Expected End:  09/01/24       Disciplines: Interdisciplinary, PROVIDER              ProgressTowards Goals: Progressing  Interventions: CBT  Summary: George Kelley is a 43 y.o. male who presents with Schizoaffective Disorder, Bipolar type and PTSD.   Suicidal/Homicidal: Nowithout intent/plan  Therapist Response: George Kelley engaged well in individual virtual session with Facilities manager.  Clinician utilized CBT to process thoughts feelings and interactions.  Clinician discussed updates in daily mood and interactions.  Clinician explored updates regarding finding legal support.  Clinician provided feedback on thought processes, circular thinking, and provided feedback about PTSD symptoms.  Clinician assisted George Kelley in continuing to work on his trauma narrative.  George Kelley responded appropriately and was able to de-escalate with support.  Plan: Return again in 2 weeks.  Diagnosis: Schizoaffective disorder, bipolar type (HCC)  PTSD (post-traumatic stress disorder)  Collaboration of Care: Patient refused AEB none at this time  Patient/Guardian was advised Release of Information must be obtained prior to any record release in order to collaborate their care with an outside provider. Patient/Guardian was advised if they have not already done so to contact the registration department to sign all necessary forms in order for us  to release information regarding their care.   Consent: Patient/Guardian gives verbal consent for treatment and assignment of benefits for services provided during this visit. Patient/Guardian expressed understanding and agreed to proceed.   George Stare [place], LCSW 04/06/2024

## 2024-04-26 ENCOUNTER — Ambulatory Visit (INDEPENDENT_AMBULATORY_CARE_PROVIDER_SITE_OTHER): Admitting: Licensed Clinical Social Worker

## 2024-04-26 DIAGNOSIS — F250 Schizoaffective disorder, bipolar type: Secondary | ICD-10-CM

## 2024-04-28 ENCOUNTER — Encounter (HOSPITAL_COMMUNITY): Payer: Self-pay | Admitting: Licensed Clinical Social Worker

## 2024-04-28 NOTE — Progress Notes (Signed)
 Virtual Visit via Video Note  I connected with George Kelley on 04/26/24 at  2:30 PM EDT by a video enabled telemedicine application and verified that I am speaking with the correct person using two identifiers.  Location: Patient: home Provider: home office   I discussed the limitations of evaluation and management by telemedicine and the availability of in person appointments. The patient expressed understanding and agreed to proceed.   I discussed the assessment and treatment plan with the patient. The patient was provided an opportunity to ask questions and all were answered. The patient agreed with the plan and demonstrated an understanding of the instructions.   The patient was advised to call back or seek an in-person evaluation if the symptoms worsen or if the condition fails to improve as anticipated.  I provided 45 minutes of non-face-to-face time during this encounter.   Harlene JONELLE Rosser, LCSW   THERAPIST PROGRESS NOTE  Session Time: 2:30pm-3:15pm  Participation Level: Active  Behavioral Response: CasualAlertIrritable  Type of Therapy: Individual Therapy  Treatment Goals addressed:  Problem: BH CCP Acute or Chronic Trauma Reaction         Dates: Start:  03/01/24           Disciplines: Interdisciplinary, PROVIDER               Goal: LTG: Recall traumatic events without becoming overwhelmed with negative emotions     Dates: Start:  03/01/24    Expected End:  09/01/24       Disciplines: Interdisciplinary, PROVIDER                Goal: STG: George Kelley will describe the signs and symptoms of PTSD that are experienced and how they interfere with daily living     Dates: Start:  03/01/24    Expected End:  09/01/24       Disciplines: Interdisciplinary, PROVIDER                Goal: STG: George Kelley will identify coping strategies to deal with trauma memories and the associated emotional reaction     Dates: Start:  03/01/24    Expected End:  09/01/24       Disciplines:  Interdisciplinary, PROVIDER                Goal: STG: George Kelley will verbalize an increased sense of mastery over PTSD symptoms by using several techniques to cope with flashbacks, decrease the power of triggers, and decrease negative thinking     Dates: Start:  03/01/24    Expected End:  09/01/24       Disciplines: Interdisciplinary, PROVIDER         ProgressTowards Goals: Not Progressing  Interventions: Motivational Interviewing  Summary: George Kelley is a 43 y.o. male who presents with Schizoaffective disorder, bipolar type.   Suicidal/Homicidal: Nowithout intent/plan  Therapist Response: George Kelley engaged well in virtual session with clinician. Clinician utilized MI OARS to reflect and summarize thoughts, feelings, and interactions. George Kelley shared that for the most part, he has been okay. However, he continues to get very aggressive and loud when he is frustrated or annoyed. Clinician noted that while George Kelley is a caring person, he can come across as verbally aggressive and very loud with only a minor trigger. Clinician has not experienced any physical aggression. Clinician discussed continuing frustration with having to participate in probation meetings, as well as having a felony on his record. Clinician reviewed [place] DOJ search and questioned this as only a misdemeanor presented in  the search.  George Kelley shared current stress re: wife in hospital due to CHF and diabetes.  Plan: Return again in 4 weeks.  Diagnosis: Schizoaffective disorder, bipolar type (HCC)  Collaboration of Care: Patient refused AEB none required  Patient/Guardian was advised Release of Information must be obtained prior to any record release in order to collaborate their care with an outside provider. Patient/Guardian was advised if they have not already done so to contact the registration department to sign all necessary forms in order for us  to release information regarding their care.   Consent: Patient/Guardian  gives verbal consent for treatment and assignment of benefits for services provided during this visit. Patient/Guardian expressed understanding and agreed to proceed.   Harlene SAUNDERS [place], LCSW 04/28/2024

## 2024-05-31 ENCOUNTER — Ambulatory Visit (HOSPITAL_COMMUNITY): Admitting: Licensed Clinical Social Worker

## 2024-05-31 ENCOUNTER — Encounter (HOSPITAL_COMMUNITY): Payer: Self-pay

## 2024-06-06 ENCOUNTER — Ambulatory Visit (INDEPENDENT_AMBULATORY_CARE_PROVIDER_SITE_OTHER): Admitting: Licensed Clinical Social Worker

## 2024-06-06 DIAGNOSIS — F4310 Post-traumatic stress disorder, unspecified: Secondary | ICD-10-CM | POA: Diagnosis not present

## 2024-06-06 DIAGNOSIS — F250 Schizoaffective disorder, bipolar type: Secondary | ICD-10-CM

## 2024-06-10 ENCOUNTER — Encounter (HOSPITAL_COMMUNITY): Payer: Self-pay | Admitting: Licensed Clinical Social Worker

## 2024-06-10 NOTE — Progress Notes (Signed)
 Virtual Visit via Video Note  I connected with George Kelley on 06/06/24 at  2:30 PM EDT by a video enabled telemedicine application and verified that I am speaking with the correct person using two identifiers.  Location: Patient: home Provider: home office   I discussed the limitations of evaluation and management by telemedicine and the availability of in person appointments. The patient expressed understanding and agreed to proceed.   I discussed the assessment and treatment plan with the patient. The patient was provided an opportunity to ask questions and all were answered. The patient agreed with the plan and demonstrated an understanding of the instructions.   The patient was advised to call back or seek an in-person evaluation if the symptoms worsen or if the condition fails to improve as anticipated.  I provided 45 minutes of non-face-to-face time during this encounter.   Harlene JONELLE Rosser, LCSW   THERAPIST PROGRESS NOTE  Session Time: 2:30pm-3:15pm  Participation Level: Active  Behavioral Response: CasualAlertAngry  Type of Therapy: Individual Therapy  Treatment Goals addressed:  Problem: BH CCP Acute or Chronic Trauma Reaction         Dates: Start:  03/01/24           Disciplines: Interdisciplinary, PROVIDER               Goal: LTG: Recall traumatic events without becoming overwhelmed with negative emotions     Dates: Start:  03/01/24    Expected End:  09/01/24       Disciplines: Interdisciplinary, PROVIDER                Goal: STG: George Kelley will describe the signs and symptoms of PTSD that are experienced and how they interfere with daily living     Dates: Start:  03/01/24    Expected End:  09/01/24       Disciplines: Interdisciplinary, PROVIDER                Goal: STG: George Kelley will identify coping strategies to deal with trauma memories and the associated emotional reaction     Dates: Start:  03/01/24    Expected End:  09/01/24       Disciplines:  Interdisciplinary, PROVIDER                Goal: STG: George Kelley will verbalize an increased sense of mastery over PTSD symptoms by using several techniques to cope with flashbacks, decrease the power of triggers, and decrease negative thinking     Dates: Start:  03/01/24    Expected End:  09/01/24       Disciplines: Interdisciplinary, PROVIDER          ProgressTowards Goals: Not Progressing  Interventions: Motivational Interviewing  Summary: George Kelley is a 43 y.o. male who presents with Schizoaffective Disorder, bipolar type and PTSD.   Suicidal/Homicidal: Nowithout intent/plan  Therapist Response: George Kelley engaged well in individual virtual session with clinician. Clinician utilized MI OARS to reflect and summarize thoughts, feelings, and behaviors. Clinician identified continuing anger and inability to move forward through his pain. Clinician provided time for George Kelley to restate his traumatic experience and to process thoughts and feelings. Clinician explored ways to move forward with his life, including taking care of himself, focusing on what he can control, and continuing to seek out support for working or through the court system. Clinician reflected the continuing anger building up due to rumination. Clinician encouraged more positive activities if possible to get through these stresses.   Plan: Return  again in 2 weeks.  Diagnosis: Schizoaffective disorder, bipolar type (HCC)  PTSD (post-traumatic stress disorder)  Collaboration of Care: Patient refused AEB none requested  Patient/Guardian was advised Release of Information must be obtained prior to any record release in order to collaborate their care with an outside provider. Patient/Guardian was advised if they have not already done so to contact the registration department to sign all necessary forms in order for us  to release information regarding their care.   Consent: Patient/Guardian gives verbal consent for treatment  and assignment of benefits for services provided during this visit. Patient/Guardian expressed understanding and agreed to proceed.   Harlene SAUNDERS [place], LCSW 06/10/2024

## 2024-07-04 ENCOUNTER — Ambulatory Visit (INDEPENDENT_AMBULATORY_CARE_PROVIDER_SITE_OTHER): Admitting: Licensed Clinical Social Worker

## 2024-07-04 DIAGNOSIS — F250 Schizoaffective disorder, bipolar type: Secondary | ICD-10-CM

## 2024-07-09 ENCOUNTER — Encounter (HOSPITAL_COMMUNITY): Payer: Self-pay | Admitting: Licensed Clinical Social Worker

## 2024-07-09 NOTE — Progress Notes (Signed)
 Virtual Visit via Video Note  I connected with George Kelley on 07/04/24 at  2:30 PM EDT by a video enabled telemedicine application and verified that I am speaking with the correct person using two identifiers.  Location: Patient: home Provider: home office   I discussed the limitations of evaluation and management by telemedicine and the availability of in person appointments. The patient expressed understanding and agreed to proceed.   I discussed the assessment and treatment plan with the patient. The patient was provided an opportunity to ask questions and all were answered. The patient agreed with the plan and demonstrated an understanding of the instructions.   The patient was advised to call back or seek an in-person evaluation if the symptoms worsen or if the condition fails to improve as anticipated.  I provided 45 minutes of non-face-to-face time during this encounter.   Harlene JONELLE Rosser, LCSW   THERAPIST PROGRESS NOTE  Session Time: 2:30pm-3:15pm  Participation Level: Active  Behavioral Response: CasualAlertAngry, Depressed, and Irritable  Type of Therapy: Individual Therapy  Treatment Goals addressed:  Problem: BH CCP Acute or Chronic Trauma Reaction         Dates: Start:  03/01/24           Disciplines: Interdisciplinary, PROVIDER               Goal: LTG: Recall traumatic events without becoming overwhelmed with negative emotions     Dates: Start:  03/01/24    Expected End:  09/01/24       Disciplines: Interdisciplinary, PROVIDER                Goal: STG: Vane will describe the signs and symptoms of PTSD that are experienced and how they interfere with daily living     Dates: Start:  03/01/24    Expected End:  09/01/24       Disciplines: Interdisciplinary, PROVIDER                Goal: STG: Devynn will identify coping strategies to deal with trauma memories and the associated emotional reaction     Dates: Start:  03/01/24    Expected End:   09/01/24       Disciplines: Interdisciplinary, PROVIDER                Goal: STG: Borden will verbalize an increased sense of mastery over PTSD symptoms by using several techniques to cope with flashbacks, decrease the power of triggers, and decrease negative thinking     Dates: Start:  03/01/24    Expected End:  09/01/24       Disciplines: Interdisciplinary, PROVIDER         ProgressTowards Goals: Not Progressing  Interventions: Motivational Interviewing  Summary: George Kelley is a 43 y.o. male who presents with Schizoaffective Disorder, bipolar type.   Suicidal/Homicidal: Nowithout intent/plan  Therapist Response: Sender engaged well in individual virtual session with clinician. Clinician utilized MI OARS to reflect and summarize thoughts, feelings, and interactions. Clinician processed current mood and noted continuing anger about the arrest and his current charges. Clinician provided time and space for Nazar to share his feelings and to process his anger. Clinician discussed coping skills and explored activities, such as exercise and socialization with family. Clinician noted that there are very few social supports and this is intentional due to concern about trust with people. Clinician discussed communication skills with wife and noted that they are supportive of each other.   Plan: Return again in 2-4  weeks.  Diagnosis: Schizoaffective disorder, bipolar type (HCC)  Collaboration of Care: Patient refused AEB none required  Patient/Guardian was advised Release of Information must be obtained prior to any record release in order to collaborate their care with an outside provider. Patient/Guardian was advised if they have not already done so to contact the registration department to sign all necessary forms in order for us  to release information regarding their care.   Consent: Patient/Guardian gives verbal consent for treatment and assignment of benefits for services provided  during this visit. Patient/Guardian expressed understanding and agreed to proceed.   Harlene SAUNDERS [place], LCSW 07/09/2024

## 2024-08-01 ENCOUNTER — Ambulatory Visit (INDEPENDENT_AMBULATORY_CARE_PROVIDER_SITE_OTHER): Admitting: Licensed Clinical Social Worker

## 2024-08-01 DIAGNOSIS — F4310 Post-traumatic stress disorder, unspecified: Secondary | ICD-10-CM | POA: Diagnosis not present

## 2024-08-01 DIAGNOSIS — F250 Schizoaffective disorder, bipolar type: Secondary | ICD-10-CM

## 2024-08-05 ENCOUNTER — Encounter (HOSPITAL_COMMUNITY): Payer: Self-pay | Admitting: Licensed Clinical Social Worker

## 2024-08-05 NOTE — Progress Notes (Signed)
 Virtual Visit via Video Note  I connected with George Kelley on 08/01/24 at  2:30 PM EDT by a video enabled telemedicine application and verified that I am speaking with the correct person using two identifiers.  Location: Patient: home Provider: home office   I discussed the limitations of evaluation and management by telemedicine and the availability of in person appointments. The patient expressed understanding and agreed to proceed.   I discussed the assessment and treatment plan with the patient. The patient was provided an opportunity to ask questions and all were answered. The patient agreed with the plan and demonstrated an understanding of the instructions.   The patient was advised to call back or seek an in-person evaluation if the symptoms worsen or if the condition fails to improve as anticipated.  I provided 45 minutes of non-face-to-face time during this encounter.   George JONELLE Rosser, LCSW   THERAPIST PROGRESS NOTE  Session Time: 2:30pm-3:15pm  Participation Level: Active  Behavioral Response: NeatAlertDepressed and Irritable  Type of Therapy: Individual Therapy  Treatment Goals addressed:  Problem: BH CCP Acute or Chronic Trauma Reaction         Dates: Start:  03/01/24           Disciplines: Interdisciplinary, PROVIDER               Goal: LTG: Recall traumatic events without becoming overwhelmed with negative emotions     Dates: Start:  03/01/24    Expected End:  09/01/24       Disciplines: Interdisciplinary, PROVIDER                Goal: STG: George Kelley will describe the signs and symptoms of PTSD that are experienced and how they interfere with daily living     Dates: Start:  03/01/24    Expected End:  09/01/24       Disciplines: Interdisciplinary, PROVIDER                Goal: STG: George Kelley will identify coping strategies to deal with trauma memories and the associated emotional reaction     Dates: Start:  03/01/24    Expected End:  09/01/24        Disciplines: Interdisciplinary, PROVIDER                Goal: STG: George Kelley will verbalize an increased sense of mastery over PTSD symptoms by using several techniques to cope with flashbacks, decrease the power of triggers, and decrease negative thinking     Dates: Start:  03/01/24    Expected End:  09/01/24       Disciplines: Interdisciplinary, PROVIDER         ProgressTowards Goals: Progressing  Interventions: CBT  Summary: George Kelley is a 43 y.o. male who presents with Schizoaffective Disorder, Bipolar type and PTSD.   Suicidal/Homicidal: Nowithout intent/plan  Therapist Response: George Kelley engaged well in individual virtual session with clinician. Clinician utilized CBT to process thoughts, feelings, and behaviors. George Kelley shared continuing stress and anger regarding his probation and the trauma of being under the eye of the police. Clinician explored how this has impacted daily life. Clinician discussed options for maintaining relationships, getting a job, or doing some type of activity in order to occupy his time. George Kelley shared that he spends most of his time reading and re-reading court documents and information about his probation. Clinician contacted PO in session to find out what is expected of George Kelley during this time.    Plan: Return again in  2 weeks.  Diagnosis: Schizoaffective disorder, bipolar type (HCC)  PTSD (post-traumatic stress disorder)  Collaboration of Care: Community Stakeholder(s) AEB talke to [employer] and received info about their need for a mental health assessment for their records.   Patient/Guardian was advised Release of Information must be obtained prior to any record release in order to collaborate their care with an outside provider. Patient/Guardian was advised if they have not already done so to contact the registration department to sign all necessary forms in order for us  to release information regarding their care.   Consent:  Patient/Guardian gives verbal consent for treatment and assignment of benefits for services provided during this visit. Patient/Guardian expressed understanding and agreed to proceed.   George SAUNDERS [place], LCSW 08/05/2024

## 2024-08-16 ENCOUNTER — Ambulatory Visit (INDEPENDENT_AMBULATORY_CARE_PROVIDER_SITE_OTHER): Admitting: Licensed Clinical Social Worker

## 2024-08-16 DIAGNOSIS — F4310 Post-traumatic stress disorder, unspecified: Secondary | ICD-10-CM

## 2024-08-16 DIAGNOSIS — F250 Schizoaffective disorder, bipolar type: Secondary | ICD-10-CM | POA: Diagnosis not present

## 2024-08-23 ENCOUNTER — Encounter (HOSPITAL_COMMUNITY): Payer: Self-pay | Admitting: Licensed Clinical Social Worker

## 2024-08-23 NOTE — Progress Notes (Signed)
 Virtual Visit via Video Note  I connected with George Kelley on 08/16/24 at 10:00 AM EDT by a video enabled telemedicine application and verified that I am speaking with the correct person using two identifiers.  Location: Patient: home Provider: home office   I discussed the limitations of evaluation and management by telemedicine and the availability of in person appointments. The patient expressed understanding and agreed to proceed.   I discussed the assessment and treatment plan with the patient. The patient was provided an opportunity to ask questions and all were answered. The patient agreed with the plan and demonstrated an understanding of the instructions.   The patient was advised to call back or seek an in-person evaluation if the symptoms worsen or if the condition fails to improve as anticipated.  I provided 45 minutes of non-face-to-face time during this encounter.   George JONELLE Rosser, LCSW   THERAPIST PROGRESS NOTE  Session Time: 10:00am-10:45am  Participation Level: Active  Behavioral Response: NeatAlertAngry  Type of Therapy: Individual Therapy  Treatment Goals addressed:  Problem: BH CCP Acute or Chronic Trauma Reaction         Dates: Start:  03/01/24           Disciplines: Interdisciplinary, PROVIDER               Goal: LTG: Recall traumatic events without becoming overwhelmed with negative emotions     Dates: Start:  03/01/24    Expected End:  09/01/24       Disciplines: Interdisciplinary, PROVIDER                Goal: STG: George Kelley will describe the signs and symptoms of PTSD that are experienced and how they interfere with daily living     Dates: Start:  03/01/24    Expected End:  09/01/24       Disciplines: Interdisciplinary, PROVIDER                Goal: STG: George Kelley will identify coping strategies to deal with trauma memories and the associated emotional reaction     Dates: Start:  03/01/24    Expected End:  09/01/24       Disciplines:  Interdisciplinary, PROVIDER      ProgressTowards Goals: Not Progressing  Interventions: Motivational Interviewing  Summary: George Kelley is a 43 y.o. male who presents with Schizoaffective Disorder, bipolar type and PTSD.   Suicidal/Homicidal: Nowithout intent/plan  Therapist Response: George Kelley engaged well in individual virtual session with clinician. Clinician utilized CBT to process thoughts, feelings, and interactions. Clinician processed current mood and noted that he frequently jumps from calm and relaxed to aggressive and angry with little agitation. Clinician explored trigger to anger and noted continuing resurfacing of trauma related to his charges and his status as a felon. George Kelley continues to assert his innocence and his anger with the police and judge. Clinician encouraged George Kelley to continue searching for a lawyer to represent him. Clinician provided coping skills for self care, as well as ways to stop himself from getting loud and changing the conversation.   Plan: Return again in 2 weeks.  Diagnosis: Schizoaffective disorder, bipolar type (HCC)  PTSD (post-traumatic stress disorder)  Collaboration of Care: Patient refused AEB none required  Patient/Guardian was advised Release of Information must be obtained prior to any record release in order to collaborate their care with an outside provider. Patient/Guardian was advised if they have not already done so to contact the registration department to sign all necessary forms in  order for us  to release information regarding their care.   Consent: Patient/Guardian gives verbal consent for treatment and assignment of benefits for services provided during this visit. Patient/Guardian expressed understanding and agreed to proceed.   George SAUNDERS [place], LCSW 08/23/2024

## 2024-09-05 ENCOUNTER — Ambulatory Visit (INDEPENDENT_AMBULATORY_CARE_PROVIDER_SITE_OTHER): Admitting: Licensed Clinical Social Worker

## 2024-09-05 ENCOUNTER — Ambulatory Visit (HOSPITAL_COMMUNITY): Admitting: Licensed Clinical Social Worker

## 2024-09-05 ENCOUNTER — Encounter (HOSPITAL_COMMUNITY): Payer: Self-pay

## 2024-09-05 DIAGNOSIS — F250 Schizoaffective disorder, bipolar type: Secondary | ICD-10-CM

## 2024-09-10 ENCOUNTER — Encounter (HOSPITAL_COMMUNITY): Payer: Self-pay | Admitting: Licensed Clinical Social Worker

## 2024-09-10 NOTE — Progress Notes (Signed)
 Virtual Visit via Video Note  I connected with Benson Belloso on 09/05/24 at  1:30 PM EST by a video enabled telemedicine application and verified that I am speaking with the correct person using two identifiers.  Location: Patient: home Provider: home office   I discussed the limitations of evaluation and management by telemedicine and the availability of in person appointments. The patient expressed understanding and agreed to proceed.  I discussed the assessment and treatment plan with the patient. The patient was provided an opportunity to ask questions and all were answered. The patient agreed with the plan and demonstrated an understanding of the instructions.   The patient was advised to call back or seek an in-person evaluation if the symptoms worsen or if the condition fails to improve as anticipated.  I provided 45 minutes of non-face-to-face time during this encounter.   Harlene JONELLE Rosser, LCSW   THERAPIST PROGRESS NOTE  Session Time: 1:30pm-2:15pm  Participation Level: Active  Behavioral Response: NeatAlertAngry, Anxious, and Irritable  Type of Therapy: Individual Therapy  Treatment Goals addressed:  Problem: BH CCP Acute or Chronic Trauma Reaction         Dates: Start:  03/01/24           Disciplines: Interdisciplinary, PROVIDER               Goal: LTG: Recall traumatic events without becoming overwhelmed with negative emotions     Dates: Start:  03/01/24    Expected End:  09/01/24       Disciplines: Interdisciplinary, PROVIDER                Goal: STG: Lorie will describe the signs and symptoms of PTSD that are experienced and how they interfere with daily living     Dates: Start:  03/01/24    Expected End:  09/01/24       Disciplines: Interdisciplinary, PROVIDER                Goal: STG: Daryn will identify coping strategies to deal with trauma memories and the associated emotional reaction     Dates: Start:  03/01/24    Expected End:  09/01/24        Disciplines: Interdisciplinary, PROVIDER       ProgressTowards Goals: Progressing  Interventions: CBT  Summary: Price Lachapelle is a 43 y.o. male who presents with Schizoaffective Disorder, bipolar type.   Suicidal/Homicidal: Nowithout intent/plan  Therapist Response: Derico engaged well in individual virtual session with clinician. Clinician utilized CBT to process thoughts, feelings, and interactions. Clinician explored current concerns about wife, who had to have part of her foot amputated today due to diabetes. Clinician processed concerns and sadness. Clinician also noted frustration since he is unable to be at the hospital due to his past bxs. Clinician discussed Yanni's past behaviors and noted that these seem to be present under duress. Clinician identified ways to self-soothe and stay calm when under stress in order to get appropriate treatment. Clinician provided time and space for Mikal to share his concerns and frustrations with his court case, lack of legal aid, and frustration that no one will support him in his case against the [employer].   Plan: Return again in 2-4 weeks.  Diagnosis: Schizoaffective disorder, bipolar type (HCC)  Collaboration of Care: Patient refused AEB none required  Patient/Guardian was advised Release of Information must be obtained prior to any record release in order to collaborate their care with an outside provider. Patient/Guardian was advised if they have  not already done so to contact the registration department to sign all necessary forms in order for us  to release information regarding their care.   Consent: Patient/Guardian gives verbal consent for treatment and assignment of benefits for services provided during this visit. Patient/Guardian expressed understanding and agreed to proceed.   Harlene SAUNDERS [place], LCSW 09/10/2024

## 2024-09-19 ENCOUNTER — Ambulatory Visit (HOSPITAL_COMMUNITY): Admitting: Licensed Clinical Social Worker

## 2024-09-19 ENCOUNTER — Encounter (HOSPITAL_COMMUNITY): Payer: Self-pay | Admitting: Licensed Clinical Social Worker

## 2024-09-19 DIAGNOSIS — F4310 Post-traumatic stress disorder, unspecified: Secondary | ICD-10-CM | POA: Diagnosis not present

## 2024-09-19 DIAGNOSIS — F250 Schizoaffective disorder, bipolar type: Secondary | ICD-10-CM

## 2024-09-19 NOTE — Progress Notes (Signed)
 Virtual Visit via Video Note  I connected with [name] Goltz on 09/19/24 at  2:30 PM EST by a video enabled telemedicine application and verified that I am speaking with the correct person using two identifiers.  Location: Patient: home Provider: home office   I discussed the limitations of evaluation and management by telemedicine and the availability of in person appointments. The patient expressed understanding and agreed to proceed.   I discussed the assessment and treatment plan with the patient. The patient was provided an opportunity to ask questions and all were answered. The patient agreed with the plan and demonstrated an understanding of the instructions.   The patient was advised to call back or seek an in-person evaluation if the symptoms worsen or if the condition fails to improve as anticipated.  I provided 45 minutes of non-face-to-face time during this encounter.   Harlene JONELLE Rosser, LCSW   THERAPIST PROGRESS NOTE  Session Time: 2:30pm-3:15pm   Participation Level: Active  Behavioral Response: NeatAlertAngry and Irritable  Type of Therapy: Individual Therapy  Treatment Goals addressed:  Problem: BH CCP Acute or Chronic Trauma Reaction         Dates: Start:  03/01/24           Disciplines: Interdisciplinary, PROVIDER               Goal: LTG: Recall traumatic events without becoming overwhelmed with negative emotions     Dates: Start:  03/01/24    Expected End:  09/01/24       Disciplines: Interdisciplinary, PROVIDER                Goal: STG: Javonni will describe the signs and symptoms of PTSD that are experienced and how they interfere with daily living     Dates: Start:  03/01/24    Expected End:  09/01/24       Disciplines: Interdisciplinary, PROVIDER                Goal: STG: Briggs will identify coping strategies to deal with trauma memories and the associated emotional reaction     Dates: Start:  03/01/24    Expected End:  09/01/24        Disciplines: Interdisciplinary, PROVIDER      ProgressTowards Goals: Not Progressing  Interventions: CBT  Summary: Travontae Freiberger is a 43 y.o. male who presents with Schizoaffective Disorder, bipolar type and PTSD.   Suicidal/Homicidal: Nowithout intent/plan  Therapist Response: Godric engaged well in individual virtual session with clinician. Clinician utilized CBT to process thoughts, feelings, and behaviors. Clinician explored updates with client's wife, noting a recent surgery. Clinician processed thoughts and feelings, as well as a sense of frustration and anger about how the surgery went. Clinician provided time and space for Everhett to share his trauma experience and how this hospital experience triggered a lot of anger similar to his experience with the police. Clinician processed trauma story and explored options for support in the community.   Plan: Return again in 2-3 weeks.  Diagnosis: Schizoaffective disorder, bipolar type (HCC)  PTSD (post-traumatic stress disorder)  Collaboration of Care: Referral or follow-up with counselor/therapist AEB referral to attorney  Patient/Guardian was advised Release of Information must be obtained prior to any record release in order to collaborate their care with an outside provider. Patient/Guardian was advised if they have not already done so to contact the registration department to sign all necessary forms in order for us  to release information regarding their care.   Consent:  Patient/Guardian gives verbal consent for treatment and assignment of benefits for services provided during this visit. Patient/Guardian expressed understanding and agreed to proceed.   Harlene SAUNDERS [place], LCSW 09/19/2024

## 2024-10-04 ENCOUNTER — Ambulatory Visit (INDEPENDENT_AMBULATORY_CARE_PROVIDER_SITE_OTHER): Admitting: Licensed Clinical Social Worker

## 2024-10-04 ENCOUNTER — Encounter (HOSPITAL_COMMUNITY): Payer: Self-pay | Admitting: Licensed Clinical Social Worker

## 2024-10-04 DIAGNOSIS — F250 Schizoaffective disorder, bipolar type: Secondary | ICD-10-CM

## 2024-10-04 DIAGNOSIS — F4310 Post-traumatic stress disorder, unspecified: Secondary | ICD-10-CM

## 2024-10-04 NOTE — Progress Notes (Signed)
 Virtual Visit via Video Note  I connected with George Kelley on 10/04/2024 at 10:00 AM EST by a video enabled telemedicine application and verified that I am speaking with the correct person using two identifiers.  Location: Patient: home Provider: home office   I discussed the limitations of evaluation and management by telemedicine and the availability of in person appointments. The patient expressed understanding and agreed to proceed.   I discussed the assessment and treatment plan with the patient. The patient was provided an opportunity to ask questions and all were answered. The patient agreed with the plan and demonstrated an understanding of the instructions.   The patient was advised to call back or seek an in-person evaluation if the symptoms worsen or if the condition fails to improve as anticipated.  I provided 45 minutes of non-face-to-face time during this encounter.   George JONELLE Rosser, LCSW   THERAPIST PROGRESS NOTE  Session Time: 10:00am-10:45am  Participation Level: Active  Behavioral Response: CasualAlertAngry and Irritable  Type of Therapy: Individual Therapy  Treatment Goals addressed:  Problem: BH CCP Acute or Chronic Trauma Reaction         Dates: Start:  03/01/24           Disciplines: Interdisciplinary, PROVIDER               Goal: LTG: Recall traumatic events without becoming overwhelmed with negative emotions     Dates: Start:  03/01/24    Expected End:  09/01/24       Disciplines: Interdisciplinary, PROVIDER                Goal: STG: Elman will describe the signs and symptoms of PTSD that are experienced and how they interfere with daily living     Dates: Start:  03/01/24    Expected End:  09/01/24       Disciplines: Interdisciplinary, PROVIDER                Goal: STG: George Kelley will identify coping strategies to deal with trauma memories and the associated emotional reaction     Dates: Start:  03/01/24    Expected End:  09/01/24        Disciplines: Interdisciplinary, PROVIDER       ProgressTowards Goals: Not Progressing  Interventions: Motivational Interviewing and Solution Focused  Summary: George Kelley is a 43 y.o. male who presents with Schizoaffective Disorder, bipolar type and PTSD.   Suicidal/Homicidal: Nowithout intent/plan  Therapist Response: George Kelley engaged well in individual virtual session with clinician. Clinician utilized MI OARS and Solution Focused therapy to process thoughts, feelings, and behaviors. Clinician identified continuing anger and rumination about traumatic incident with police. He shared ongoing triggers that are present whenever he hears a siren, sees a police car, or an technical sales engineer in the community. Clinician explored coping skills and noted that George Kelley has been isolating more over the past few weeks. Clinician explored updates with wife, who had to have half of her leg amputated due to diabetes. Clinician processed George Kelley's need to advocate for wife while she is in the hospital, as well as agitation due to police officers often being around the hospital as well.   Plan: Return again in 3-4 weeks.  Diagnosis: Schizoaffective disorder, bipolar type (HCC)  PTSD (post-traumatic stress disorder)  Collaboration of Care: Patient refused AEB none required. Medication seems to be managing mood for the most part. Concerns about PTSD triggers.  Patient/Guardian was advised Release of Information must be obtained prior to any record  release in order to collaborate their care with an outside provider. Patient/Guardian was advised if they have not already done so to contact the registration department to sign all necessary forms in order for us  to release information regarding their care.   Consent: Patient/Guardian gives verbal consent for treatment and assignment of benefits for services provided during this visit. Patient/Guardian expressed understanding and agreed to proceed.   George Kelley  [place], LCSW 10/04/2024

## 2024-11-01 ENCOUNTER — Encounter (HOSPITAL_COMMUNITY): Payer: Self-pay

## 2024-11-01 ENCOUNTER — Ambulatory Visit (HOSPITAL_COMMUNITY): Admitting: Licensed Clinical Social Worker

## 2024-11-06 ENCOUNTER — Encounter (HOSPITAL_COMMUNITY): Payer: Self-pay | Admitting: Licensed Clinical Social Worker

## 2024-11-06 ENCOUNTER — Encounter (HOSPITAL_COMMUNITY): Payer: Self-pay

## 2024-11-06 ENCOUNTER — Ambulatory Visit (INDEPENDENT_AMBULATORY_CARE_PROVIDER_SITE_OTHER): Admitting: Licensed Clinical Social Worker

## 2024-11-06 DIAGNOSIS — F250 Schizoaffective disorder, bipolar type: Secondary | ICD-10-CM

## 2024-11-06 NOTE — Progress Notes (Signed)
 Virtual Visit via Video Note  I connected with George Kelley on 11/06/2024 at  8:00 AM EST by a video enabled telemedicine application and verified that I am speaking with the correct person using two identifiers.  Location: Patient: home Provider: office   I discussed the limitations of evaluation and management by telemedicine and the availability of in person appointments. The patient expressed understanding and agreed to proceed.   I discussed the assessment and treatment plan with the patient. The patient was provided an opportunity to ask questions and all were answered. The patient agreed with the plan and demonstrated an understanding of the instructions.   The patient was advised to call back or seek an in-person evaluation if the symptoms worsen or if the condition fails to improve as anticipated.  I provided 45 minutes of non-face-to-face time during this encounter.   Harlene JONELLE Rosser, LCSW   THERAPIST PROGRESS NOTE  Session Time: 8:00am-8:45am  Participation Level: Active  Behavioral Response: NeatAlertEuthymic and Irritable  Type of Therapy: Individual Therapy  Treatment Goals addressed:  Active     BH CCP Acute or Chronic Trauma Reaction     LTG: Recall traumatic events without becoming overwhelmed with negative emotions (Progressing)     Start:  03/01/24    Expected End:  09/01/24         STG: George Kelley will describe the signs and symptoms of PTSD that are experienced and how they interfere with daily living (Progressing)     Start:  03/01/24    Expected End:  09/01/24         STG: George Kelley will identify coping strategies to deal with trauma memories and the associated emotional reaction (Progressing)     Start:  03/01/24    Expected End:  09/01/24         STG: George Kelley will verbalize an increased sense of mastery over PTSD symptoms by using several techniques to cope with flashbacks, decrease the power of triggers, and decrease negative thinking  (Progressing)     Start:  03/01/24    Expected End:  09/01/24               ProgressTowards Goals: Progressing  Interventions: CBT  Summary: George Kelley is a 44 y.o. male who presents with Schizoaffective Disorder, bipolar type.   Suicidal/Homicidal: Nowithout intent/plan  Therapist Response: [name] engaged well in individual virtual session with clinician. Clinician utilized CBT to process thoughts, feelings, and interactions. Clinician followed up on criminal status and noted that his charge has been reduced to misdemeanor. Clinician reflected some sense of relief, but continuing anger for what he has been through since last year. Clinician processed options for turning to gratitude or turning toward anger. Clinician reflected the energy associated with anger and explored ways to find productivity with this energy. Clinician also explored updates with wife, who has been in a rehab facility following leg amputation. Clinician reflected the joy of preparing the house so it is clean and ready for her to come home.   Plan: Return again in 2 weeks.  Diagnosis: Schizoaffective disorder, bipolar type (HCC)  Collaboration of Care: Patient refused AEB none required  Patient/Guardian was advised Release of Information must be obtained prior to any record release in order to collaborate their care with an outside provider. Patient/Guardian was advised if they have not already done so to contact the registration department to sign all necessary forms in order for us  to release information regarding their care.   Consent: Patient/Guardian gives  verbal consent for treatment and assignment of benefits for services provided during this visit. Patient/Guardian expressed understanding and agreed to proceed.   Harlene SAUNDERS [place], LCSW 11/06/2024

## 2024-11-22 ENCOUNTER — Ambulatory Visit (INDEPENDENT_AMBULATORY_CARE_PROVIDER_SITE_OTHER): Admitting: Licensed Clinical Social Worker

## 2024-11-22 DIAGNOSIS — F250 Schizoaffective disorder, bipolar type: Secondary | ICD-10-CM | POA: Diagnosis not present

## 2024-11-22 DIAGNOSIS — F4310 Post-traumatic stress disorder, unspecified: Secondary | ICD-10-CM | POA: Diagnosis not present

## 2024-11-24 ENCOUNTER — Encounter (HOSPITAL_COMMUNITY): Payer: Self-pay | Admitting: Licensed Clinical Social Worker

## 2024-11-24 NOTE — Progress Notes (Signed)
 Virtual Visit via Video Note  I connected with George Kelley on 11/22/24 at  9:00 AM EST by a video enabled telemedicine application and verified that I am speaking with the correct person using two identifiers.  Location: Patient: home Provider: home office   I discussed the limitations of evaluation and management by telemedicine and the availability of in person appointments. The patient expressed understanding and agreed to proceed.     I discussed the assessment and treatment plan with the patient. The patient was provided an opportunity to ask questions and all were answered. The patient agreed with the plan and demonstrated an understanding of the instructions.   The patient was advised to call back or seek an in-person evaluation if the symptoms worsen or if the condition fails to improve as anticipated.  I provided 45 minutes of non-face-to-face time during this encounter.   George JONELLE Rosser, LCSW   THERAPIST PROGRESS NOTE  Session Time: 9:00am-9:45am  Participation Level: Active  Behavioral Response: CasualAlertAngry and Euthymic  Type of Therapy: Individual Therapy  Treatment Goals addressed:  Active     BH CCP Acute or Chronic Trauma Reaction     LTG: Recall traumatic events without becoming overwhelmed with negative emotions (Progressing)     Start:  03/01/24    Expected End:  11/06/25         STG: George Kelley will describe the signs and symptoms of PTSD that are experienced and how they interfere with daily living (Progressing)     Start:  03/01/24    Expected End:  11/06/25         STG: George Kelley will identify coping strategies to deal with trauma memories and the associated emotional reaction (Progressing)     Start:  03/01/24    Expected End:  11/06/25         STG: George Kelley will verbalize an increased sense of mastery over PTSD symptoms by using several techniques to cope with flashbacks, decrease the power of triggers, and decrease negative thinking  (Progressing)     Start:  03/01/24    Expected End:  11/06/25            ProgressTowards Goals: Progressing  Interventions: Motivational Interviewing  Summary: George Kelley is a 44 y.o. male who presents with Schizoaffective Disorder, bipolar type and PTSD.   Suicidal/Homicidal: Nowithout intent/plan  Therapist Response: George Kelley engaged well in individual virtual session with clinician. Clinician utilized MI OARS to reflect and summarize thoughts, feelings, and interactions. Clinician processed current status of wife's health and his concerns for her. Clinician also discussed levels of PTSD sxs, including underlying anger that continues to be present 100% of the time. Clinician reflected the instant agitation that occurs when he sees police officers, watches the news, or even thinks back to the incident where he was arrested and mistreated. Clinician provided supportive counseling to reduce angst and to slow down thought processes. Clinician normalized this response, but also identified the importance of being able to function in the community.   Plan: Return again in 2 weeks.  Diagnosis: Schizoaffective disorder, bipolar type (HCC)  PTSD (post-traumatic stress disorder)  Collaboration of Care: Patient refused AEB none required  Patient/Guardian was advised Release of Information must be obtained prior to any record release in order to collaborate their care with an outside provider. Patient/Guardian was advised if they have not already done so to contact the registration department to sign all necessary forms in order for us  to release information regarding their care.  Consent: Patient/Guardian gives verbal consent for treatment and assignment of benefits for services provided during this visit. Patient/Guardian expressed understanding and agreed to proceed.   George SAUNDERS [name], LCSW 11/24/2024

## 2024-12-13 ENCOUNTER — Ambulatory Visit (HOSPITAL_COMMUNITY): Admitting: Licensed Clinical Social Worker
